# Patient Record
Sex: Female | Born: 1975 | Race: White | Hispanic: No | Marital: Married | Smoking: Never smoker
Health system: Southern US, Community
[De-identification: ages and names within clinical notes are randomized; demographics above are authoritative.]

## PROBLEM LIST (undated history)

## (undated) ENCOUNTER — Inpatient Hospital Stay (HOSPITAL_COMMUNITY): Payer: Managed Care, Other (non HMO)

## (undated) DIAGNOSIS — F32A Depression, unspecified: Secondary | ICD-10-CM

## (undated) DIAGNOSIS — O139 Gestational [pregnancy-induced] hypertension without significant proteinuria, unspecified trimester: Secondary | ICD-10-CM

## (undated) DIAGNOSIS — G4730 Sleep apnea, unspecified: Secondary | ICD-10-CM

## (undated) DIAGNOSIS — E282 Polycystic ovarian syndrome: Secondary | ICD-10-CM

## (undated) DIAGNOSIS — K219 Gastro-esophageal reflux disease without esophagitis: Secondary | ICD-10-CM

## (undated) DIAGNOSIS — F329 Major depressive disorder, single episode, unspecified: Secondary | ICD-10-CM

## (undated) DIAGNOSIS — B999 Unspecified infectious disease: Secondary | ICD-10-CM

## (undated) DIAGNOSIS — R51 Headache: Secondary | ICD-10-CM

## (undated) HISTORY — DX: Polycystic ovarian syndrome: E28.2

## (undated) HISTORY — DX: Gestational (pregnancy-induced) hypertension without significant proteinuria, unspecified trimester: O13.9

## (undated) HISTORY — DX: Headache: R51

## (undated) HISTORY — DX: Depression, unspecified: F32.A

## (undated) HISTORY — DX: Sleep apnea, unspecified: G47.30

## (undated) HISTORY — DX: Unspecified infectious disease: B99.9

## (undated) HISTORY — PX: OTHER SURGICAL HISTORY: SHX169

## (undated) HISTORY — DX: Major depressive disorder, single episode, unspecified: F32.9

---
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
## 2001-11-04 ENCOUNTER — Other Ambulatory Visit: Admission: RE | Admit: 2001-11-04 | Discharge: 2001-11-04 | Payer: Self-pay | Admitting: Obstetrics and Gynecology

## 2002-07-15 ENCOUNTER — Emergency Department (HOSPITAL_COMMUNITY): Admission: EM | Admit: 2002-07-15 | Discharge: 2002-07-15 | Payer: Self-pay | Admitting: Emergency Medicine

## 2002-07-15 ENCOUNTER — Encounter: Payer: Self-pay | Admitting: Emergency Medicine

## 2003-01-23 ENCOUNTER — Other Ambulatory Visit: Admission: RE | Admit: 2003-01-23 | Discharge: 2003-01-23 | Payer: Self-pay | Admitting: Obstetrics and Gynecology

## 2004-02-13 ENCOUNTER — Ambulatory Visit (HOSPITAL_COMMUNITY): Admission: RE | Admit: 2004-02-13 | Discharge: 2004-02-13 | Payer: Self-pay | Admitting: Obstetrics and Gynecology

## 2004-03-19 ENCOUNTER — Other Ambulatory Visit: Admission: RE | Admit: 2004-03-19 | Discharge: 2004-03-19 | Payer: Self-pay | Admitting: Obstetrics and Gynecology

## 2004-05-28 ENCOUNTER — Ambulatory Visit (HOSPITAL_COMMUNITY): Admission: RE | Admit: 2004-05-28 | Discharge: 2004-05-28 | Payer: Self-pay | Admitting: Obstetrics and Gynecology

## 2004-07-17 ENCOUNTER — Inpatient Hospital Stay (HOSPITAL_COMMUNITY): Admission: AD | Admit: 2004-07-17 | Discharge: 2004-07-17 | Payer: Self-pay | Admitting: Obstetrics and Gynecology

## 2004-10-20 ENCOUNTER — Inpatient Hospital Stay (HOSPITAL_COMMUNITY): Admission: AD | Admit: 2004-10-20 | Discharge: 2004-10-22 | Payer: Self-pay | Admitting: Obstetrics and Gynecology

## 2005-09-10 ENCOUNTER — Ambulatory Visit: Payer: Self-pay | Admitting: Internal Medicine

## 2005-10-26 ENCOUNTER — Ambulatory Visit: Payer: Self-pay | Admitting: Internal Medicine

## 2005-11-11 ENCOUNTER — Other Ambulatory Visit: Admission: RE | Admit: 2005-11-11 | Discharge: 2005-11-11 | Payer: Self-pay | Admitting: Obstetrics and Gynecology

## 2007-03-10 DIAGNOSIS — O139 Gestational [pregnancy-induced] hypertension without significant proteinuria, unspecified trimester: Secondary | ICD-10-CM

## 2007-03-10 HISTORY — DX: Gestational (pregnancy-induced) hypertension without significant proteinuria, unspecified trimester: O13.9

## 2007-09-08 ENCOUNTER — Emergency Department (HOSPITAL_COMMUNITY): Admission: EM | Admit: 2007-09-08 | Discharge: 2007-09-08 | Payer: Self-pay | Admitting: Emergency Medicine

## 2008-06-19 ENCOUNTER — Inpatient Hospital Stay (HOSPITAL_COMMUNITY): Admission: AD | Admit: 2008-06-19 | Discharge: 2008-06-19 | Payer: Self-pay | Admitting: Obstetrics and Gynecology

## 2008-06-21 ENCOUNTER — Inpatient Hospital Stay (HOSPITAL_COMMUNITY): Admission: AD | Admit: 2008-06-21 | Discharge: 2008-06-21 | Payer: Self-pay | Admitting: Obstetrics and Gynecology

## 2008-07-13 ENCOUNTER — Inpatient Hospital Stay (HOSPITAL_COMMUNITY): Admission: AD | Admit: 2008-07-13 | Discharge: 2008-07-13 | Payer: Self-pay | Admitting: Obstetrics and Gynecology

## 2008-07-15 ENCOUNTER — Inpatient Hospital Stay (HOSPITAL_COMMUNITY): Admission: AD | Admit: 2008-07-15 | Discharge: 2008-07-15 | Payer: Self-pay | Admitting: Obstetrics and Gynecology

## 2008-07-20 ENCOUNTER — Inpatient Hospital Stay (HOSPITAL_COMMUNITY): Admission: AD | Admit: 2008-07-20 | Discharge: 2008-07-20 | Payer: Self-pay | Admitting: Obstetrics and Gynecology

## 2008-07-25 ENCOUNTER — Encounter (INDEPENDENT_AMBULATORY_CARE_PROVIDER_SITE_OTHER): Payer: Self-pay | Admitting: Obstetrics and Gynecology

## 2008-07-25 ENCOUNTER — Inpatient Hospital Stay (HOSPITAL_COMMUNITY): Admission: AD | Admit: 2008-07-25 | Discharge: 2008-07-27 | Payer: Self-pay | Admitting: Obstetrics and Gynecology

## 2010-06-17 LAB — URINALYSIS, ROUTINE W REFLEX MICROSCOPIC
Bilirubin Urine: NEGATIVE
Bilirubin Urine: NEGATIVE
Bilirubin Urine: NEGATIVE
Glucose, UA: NEGATIVE mg/dL
Glucose, UA: NEGATIVE mg/dL
Glucose, UA: NEGATIVE mg/dL
Hgb urine dipstick: NEGATIVE
Hgb urine dipstick: NEGATIVE
Hgb urine dipstick: NEGATIVE
Ketones, ur: NEGATIVE mg/dL
Ketones, ur: NEGATIVE mg/dL
Ketones, ur: NEGATIVE mg/dL
Nitrite: NEGATIVE
Nitrite: NEGATIVE
Nitrite: NEGATIVE
Protein, ur: NEGATIVE mg/dL
Protein, ur: NEGATIVE mg/dL
Protein, ur: NEGATIVE mg/dL
Specific Gravity, Urine: <1.005 — ABNORMAL LOW (ref 1.005–1.030)
Specific Gravity, Urine: 1.01 (ref 1.005–1.030)
Specific Gravity, Urine: <1.005 — ABNORMAL LOW (ref 1.005–1.030)
Urobilinogen, UA: 0.2 mg/dL (ref 0.0–1.0)
Urobilinogen, UA: 0.2 mg/dL (ref 0.0–1.0)
Urobilinogen, UA: 0.2 mg/dL (ref 0.0–1.0)
pH: 6 (ref 5.0–8.0)
pH: 6 (ref 5.0–8.0)
pH: 6.5 (ref 5.0–8.0)

## 2010-06-17 LAB — COMPREHENSIVE METABOLIC PANEL
ALT: 14 U/L (ref 0–35)
ALT: 12 U/L (ref 0–35)
ALT: 13 U/L (ref 0–35)
AST: 26 U/L (ref 0–37)
AST: 21 U/L (ref 0–37)
AST: 21 U/L (ref 0–37)
Albumin: 2.7 g/dL — ABNORMAL LOW (ref 3.5–5.2)
Albumin: 2.7 g/dL — ABNORMAL LOW (ref 3.5–5.2)
Albumin: 2.8 g/dL — ABNORMAL LOW (ref 3.5–5.2)
Alkaline Phosphatase: 111 U/L (ref 39–117)
Alkaline Phosphatase: 102 U/L (ref 39–117)
Alkaline Phosphatase: 98 U/L (ref 39–117)
BUN: 8 mg/dL (ref 6–23)
BUN: 5 mg/dL — ABNORMAL LOW (ref 6–23)
BUN: 8 mg/dL (ref 6–23)
CO2: 22 mEq/L (ref 19–32)
CO2: 22 mEq/L (ref 19–32)
CO2: 24 mEq/L (ref 19–32)
Calcium: 9.3 mg/dL (ref 8.4–10.5)
Calcium: 9.2 mg/dL (ref 8.4–10.5)
Calcium: 9.4 mg/dL (ref 8.4–10.5)
Chloride: 104 mEq/L (ref 96–112)
Chloride: 102 mEq/L (ref 96–112)
Chloride: 104 mEq/L (ref 96–112)
Creatinine, Ser: 0.88 mg/dL (ref 0.4–1.2)
Creatinine, Ser: 0.72 mg/dL (ref 0.4–1.2)
Creatinine, Ser: 0.77 mg/dL (ref 0.4–1.2)
GFR calc Af Amer: >60 mL/min (ref >60)
GFR calc Af Amer: >60 mL/min (ref >60)
GFR calc Af Amer: >60 mL/min (ref >60)
GFR calc non Af Amer: >60 mL/min (ref >60)
GFR calc non Af Amer: >60 mL/min (ref >60)
GFR calc non Af Amer: >60 mL/min (ref >60)
Glucose, Bld: 106 mg/dL — ABNORMAL HIGH (ref 70–99)
Glucose, Bld: 71 mg/dL (ref 70–99)
Glucose, Bld: 84 mg/dL (ref 70–99)
Potassium: 3.7 mEq/L (ref 3.5–5.1)
Potassium: 4.1 mEq/L (ref 3.5–5.1)
Potassium: 4.2 mEq/L (ref 3.5–5.1)
Sodium: 135 mEq/L (ref 135–145)
Sodium: 133 mEq/L — ABNORMAL LOW (ref 135–145)
Sodium: 135 mEq/L (ref 135–145)
Total Bilirubin: 0.6 mg/dL (ref 0.3–1.2)
Total Bilirubin: 0.3 mg/dL (ref 0.3–1.2)
Total Bilirubin: 0.4 mg/dL (ref 0.3–1.2)
Total Protein: 6.3 g/dL (ref 6.0–8.3)
Total Protein: 5.6 g/dL — ABNORMAL LOW (ref 6.0–8.3)
Total Protein: 6.2 g/dL (ref 6.0–8.3)

## 2010-06-17 LAB — CBC
HCT: 30.5 % — ABNORMAL LOW (ref 36.0–46.0)
HCT: 37.4 % (ref 36.0–46.0)
HCT: 36.9 % (ref 36.0–46.0)
HCT: 36.9 % (ref 36.0–46.0)
Hemoglobin: 10.8 g/dL — ABNORMAL LOW (ref 12.0–15.0)
Hemoglobin: 12.9 g/dL (ref 12.0–15.0)
Hemoglobin: 12.6 g/dL (ref 12.0–15.0)
Hemoglobin: 12.9 g/dL (ref 12.0–15.0)
MCHC: 34.6 g/dL (ref 30.0–36.0)
MCHC: 35.4 g/dL (ref 30.0–36.0)
MCHC: 34.3 g/dL (ref 30.0–36.0)
MCHC: 34.9 g/dL (ref 30.0–36.0)
MCV: 96.1 fL (ref 78.0–100.0)
MCV: 96.6 fL (ref 78.0–100.0)
MCV: 95.4 fL (ref 78.0–100.0)
MCV: 95.7 fL (ref 78.0–100.0)
Platelets: 206 10*3/uL (ref 150–400)
Platelets: 232 10*3/uL (ref 150–400)
Platelets: 253 10*3/uL (ref 150–400)
Platelets: 266 10*3/uL (ref 150–400)
RBC: 3.15 MIL/uL — ABNORMAL LOW (ref 3.87–5.11)
RBC: 3.89 MIL/uL (ref 3.87–5.11)
RBC: 3.86 MIL/uL — ABNORMAL LOW (ref 3.87–5.11)
RBC: 3.87 MIL/uL (ref 3.87–5.11)
RDW: 14.5 % (ref 11.5–15.5)
RDW: 14.7 % (ref 11.5–15.5)
RDW: 14.2 % (ref 11.5–15.5)
RDW: 14.7 % (ref 11.5–15.5)
WBC: 10.5 10*3/uL (ref 4.0–10.5)
WBC: 13.1 10*3/uL — ABNORMAL HIGH (ref 4.0–10.5)
WBC: 11.2 10*3/uL — ABNORMAL HIGH (ref 4.0–10.5)
WBC: 13.6 10*3/uL — ABNORMAL HIGH (ref 4.0–10.5)

## 2010-06-17 LAB — LACTATE DEHYDROGENASE
LDH: 136 U/L (ref 94–250)
LDH: 136 U/L (ref 94–250)
LDH: 141 U/L (ref 94–250)

## 2010-06-17 LAB — URIC ACID
Uric Acid, Serum: 5.3 mg/dL (ref 2.4–7.0)
Uric Acid, Serum: 5 mg/dL (ref 2.4–7.0)
Uric Acid, Serum: 5.3 mg/dL (ref 2.4–7.0)

## 2010-06-17 LAB — RPR: RPR Ser Ql: NONREACTIVE

## 2010-06-18 LAB — COMPREHENSIVE METABOLIC PANEL
ALT: 12 U/L (ref 0–35)
AST: 21 U/L (ref 0–37)
Albumin: 2.9 g/dL — ABNORMAL LOW (ref 3.5–5.2)
Alkaline Phosphatase: 75 U/L (ref 39–117)
BUN: 7 mg/dL (ref 6–23)
CO2: 22 mEq/L (ref 19–32)
Calcium: 9.5 mg/dL (ref 8.4–10.5)
Chloride: 102 mEq/L (ref 96–112)
Creatinine, Ser: 0.69 mg/dL (ref 0.4–1.2)
GFR calc Af Amer: >60 mL/min (ref >60)
GFR calc non Af Amer: >60 mL/min (ref >60)
Glucose, Bld: 125 mg/dL — ABNORMAL HIGH (ref 70–99)
Potassium: 3.9 mEq/L (ref 3.5–5.1)
Sodium: 133 mEq/L — ABNORMAL LOW (ref 135–145)
Total Bilirubin: 0.3 mg/dL (ref 0.3–1.2)
Total Protein: 5.8 g/dL — ABNORMAL LOW (ref 6.0–8.3)

## 2010-06-18 LAB — CBC
HCT: 38.6 % (ref 36.0–46.0)
Hemoglobin: 13.4 g/dL (ref 12.0–15.0)
MCHC: 34.7 g/dL (ref 30.0–36.0)
MCV: 95.5 fL (ref 78.0–100.0)
Platelets: 277 10*3/uL (ref 150–400)
RBC: 4.04 MIL/uL (ref 3.87–5.11)
RDW: 14.4 % (ref 11.5–15.5)
WBC: 12.6 10*3/uL — ABNORMAL HIGH (ref 4.0–10.5)

## 2010-06-18 LAB — WET PREP, GENITAL

## 2010-06-18 LAB — URINALYSIS, ROUTINE W REFLEX MICROSCOPIC
Glucose, UA: NEGATIVE mg/dL
Nitrite: NEGATIVE
Specific Gravity, Urine: 1.015 (ref 1.005–1.030)
pH: 6 (ref 5.0–8.0)

## 2010-06-18 LAB — URIC ACID: Uric Acid, Serum: 4.9 mg/dL (ref 2.4–7.0)
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
## 2011-03-10 DIAGNOSIS — G4730 Sleep apnea, unspecified: Secondary | ICD-10-CM

## 2011-03-10 HISTORY — DX: Sleep apnea, unspecified: G47.30

## 2011-10-05 ENCOUNTER — Other Ambulatory Visit: Payer: Self-pay

## 2011-10-05 ENCOUNTER — Telehealth: Payer: Self-pay | Admitting: Obstetrics and Gynecology

## 2011-10-05 ENCOUNTER — Other Ambulatory Visit: Payer: Commercial Indemnity

## 2011-10-05 DIAGNOSIS — Z3490 Encounter for supervision of normal pregnancy, unspecified, unspecified trimester: Secondary | ICD-10-CM

## 2011-10-05 NOTE — Telephone Encounter (Signed)
Spoke with pt rgd concern pt early preg states need appt to discuss meds has history of sab wants appt this week with ND or any provider advised pt first available appt 10/12/11 pt has appt 10/12/11 8:15 with ND pt voice understanding

## 2011-10-05 NOTE — Telephone Encounter (Signed)
Niccole/calling back

## 2011-10-05 NOTE — Telephone Encounter (Signed)
Amber Carney/chart received

## 2011-10-06 LAB — HCG, QUANTITATIVE, PREGNANCY: hCG, Beta Chain, Quant, S: 37.5 m[IU]/mL

## 2011-10-06 LAB — PROGESTERONE: Progesterone: 9.5 ng/mL

## 2011-10-07 ENCOUNTER — Encounter: Payer: Self-pay | Admitting: Obstetrics and Gynecology

## 2011-10-07 ENCOUNTER — Ambulatory Visit (INDEPENDENT_AMBULATORY_CARE_PROVIDER_SITE_OTHER): Payer: Commercial Indemnity | Admitting: Obstetrics and Gynecology

## 2011-10-07 ENCOUNTER — Telehealth: Payer: Self-pay | Admitting: Obstetrics and Gynecology

## 2011-10-07 VITALS — BP 122/82 | Wt 255.0 lb

## 2011-10-07 DIAGNOSIS — O26849 Uterine size-date discrepancy, unspecified trimester: Secondary | ICD-10-CM

## 2011-10-08 NOTE — Telephone Encounter (Signed)
Lm on vm tcb with pharm to call in rx

## 2011-10-08 NOTE — Progress Notes (Signed)
Progesterone 9.5  BHCG 37.5   Repeat BHCG until > 1500  Then u/s at that time for viability. Progesterone supp 25 mg pv bid until after 1st trimester completed BP stable pt stopped Diovan. Will give aldomet if bp increases.

## 2011-10-12 ENCOUNTER — Encounter: Payer: Self-pay | Admitting: Obstetrics and Gynecology

## 2011-10-12 ENCOUNTER — Other Ambulatory Visit: Payer: Self-pay

## 2011-10-12 DIAGNOSIS — Z3490 Encounter for supervision of normal pregnancy, unspecified, unspecified trimester: Secondary | ICD-10-CM

## 2011-10-13 ENCOUNTER — Telehealth: Payer: Self-pay | Admitting: Obstetrics and Gynecology

## 2011-10-13 ENCOUNTER — Other Ambulatory Visit: Payer: Commercial Indemnity

## 2011-10-13 DIAGNOSIS — N912 Amenorrhea, unspecified: Secondary | ICD-10-CM

## 2011-10-14 LAB — HCG, QUANTITATIVE, PREGNANCY: hCG, Beta Chain, Quant, S: 1249.2 m[IU]/mL

## 2011-10-16 NOTE — Telephone Encounter (Signed)
Try calling pt rgd mag pt not available will call back later

## 2011-10-19 ENCOUNTER — Other Ambulatory Visit: Payer: Commercial Indemnity

## 2011-10-19 DIAGNOSIS — Z331 Pregnant state, incidental: Secondary | ICD-10-CM

## 2011-10-19 NOTE — Telephone Encounter (Signed)
Spoke with pt informed quant results informed need another quant levels are >1500 then can schd u/s pt has appt today at 2:15 lab visit quant pt voice understanding

## 2011-10-19 NOTE — Telephone Encounter (Signed)
NICCOLE/ADDRESSING ISSUE

## 2011-10-21 NOTE — Telephone Encounter (Signed)
TRIAGE/RES °

## 2011-10-21 NOTE — Telephone Encounter (Signed)
Spoke with pt rgd msg informed quant [id number] can have u/s per ND pt has appt 10/22/11 at 10:00 for u/s f/u with AR at 10:30 pt voice understading

## 2011-10-22 ENCOUNTER — Other Ambulatory Visit: Payer: Self-pay | Admitting: Obstetrics and Gynecology

## 2011-10-22 ENCOUNTER — Encounter: Payer: Self-pay | Admitting: Obstetrics and Gynecology

## 2011-10-22 ENCOUNTER — Ambulatory Visit (INDEPENDENT_AMBULATORY_CARE_PROVIDER_SITE_OTHER): Payer: Commercial Indemnity | Admitting: Obstetrics and Gynecology

## 2011-10-22 ENCOUNTER — Ambulatory Visit (INDEPENDENT_AMBULATORY_CARE_PROVIDER_SITE_OTHER): Payer: Commercial Indemnity

## 2011-10-22 VITALS — BP 114/64 | Ht 67.0 in | Wt 254.0 lb

## 2011-10-22 DIAGNOSIS — O09299 Supervision of pregnancy with other poor reproductive or obstetric history, unspecified trimester: Secondary | ICD-10-CM

## 2011-10-22 DIAGNOSIS — O26849 Uterine size-date discrepancy, unspecified trimester: Secondary | ICD-10-CM

## 2011-10-22 DIAGNOSIS — Z3490 Encounter for supervision of normal pregnancy, unspecified, unspecified trimester: Secondary | ICD-10-CM

## 2011-10-22 NOTE — Progress Notes (Signed)
Ultrasound today with EDD of June 17, 2012 which is consistent with the date of conception per patient which was July 17 they gave her a due date of April 19 although her.  Gave her a due date of able forth with the last menstrual period of June 28 E. Ultrasound today showed normal bilateral ovaries and a 5 week 6 day IUP with the fetal heart rate of 105 and 145 The patient is coming taking 25 mg of progesterone per vagina q.d. Secondary to a low progesterone level  Filed Vitals:   10/22/11 1040  BP: 114/64   A/P U/S reviewed Cont prog for first tri Sched new ob int and w/u  Pt wants to see CNMS

## 2011-10-29 LAB — US OB TRANSVAGINAL

## 2011-11-10 ENCOUNTER — Ambulatory Visit (INDEPENDENT_AMBULATORY_CARE_PROVIDER_SITE_OTHER): Payer: Commercial Indemnity | Admitting: Obstetrics and Gynecology

## 2011-11-10 DIAGNOSIS — Z331 Pregnant state, incidental: Secondary | ICD-10-CM

## 2011-11-10 LAB — POCT URINALYSIS DIPSTICK
Bilirubin, UA: NEGATIVE
Ketones, UA: NEGATIVE
Leukocytes, UA: NEGATIVE
pH, UA: 5

## 2011-11-10 NOTE — Progress Notes (Signed)
NOB interview completed.  Pt states will be moving to [place] in December.

## 2011-11-11 ENCOUNTER — Emergency Department (HOSPITAL_COMMUNITY)
Admission: EM | Admit: 2011-11-11 | Discharge: 2011-11-12 | Disposition: A | Discharge status: Home / Self Care | Payer: Commercial Indemnity | Attending: Emergency Medicine | Admitting: Emergency Medicine

## 2011-11-11 ENCOUNTER — Encounter (HOSPITAL_COMMUNITY): Payer: Self-pay | Admitting: Emergency Medicine

## 2011-11-11 DIAGNOSIS — Z79899 Other long term (current) drug therapy: Secondary | ICD-10-CM | POA: Insufficient documentation

## 2011-11-11 DIAGNOSIS — R0789 Other chest pain: Secondary | ICD-10-CM

## 2011-11-11 DIAGNOSIS — R0602 Shortness of breath: Secondary | ICD-10-CM | POA: Insufficient documentation

## 2011-11-11 DIAGNOSIS — R071 Chest pain on breathing: Secondary | ICD-10-CM | POA: Insufficient documentation

## 2011-11-11 DIAGNOSIS — O99891 Other specified diseases and conditions complicating pregnancy: Secondary | ICD-10-CM | POA: Insufficient documentation

## 2011-11-11 DIAGNOSIS — M549 Dorsalgia, unspecified: Secondary | ICD-10-CM | POA: Insufficient documentation

## 2011-11-11 DIAGNOSIS — R079 Chest pain, unspecified: Secondary | ICD-10-CM | POA: Insufficient documentation

## 2011-11-11 DIAGNOSIS — Z3490 Encounter for supervision of normal pregnancy, unspecified, unspecified trimester: Secondary | ICD-10-CM

## 2011-11-11 LAB — PRENATAL PANEL VII
Antibody Screen: NEGATIVE
Basophils Absolute: 0.1 10*3/uL (ref 0.0–0.1)
Basophils Relative: 1 % (ref 0–1)
Eosinophils Absolute: 0.1 10*3/uL (ref 0.0–0.7)
Eosinophils Relative: 1 % (ref 0–5)
HCT: 39.9 % (ref 36.0–46.0)
HIV: NONREACTIVE
Hemoglobin: 13.6 g/dL (ref 12.0–15.0)
Hepatitis B Surface Ag: NEGATIVE
Lymphocytes Relative: 25 % (ref 12–46)
Lymphs Abs: 2.1 10*3/uL (ref 0.7–4.0)
MCH: 31.7 pg (ref 26.0–34.0)
MCHC: 34.1 g/dL (ref 30.0–36.0)
MCV: 93 fL (ref 78.0–100.0)
Monocytes Absolute: 0.6 10*3/uL (ref 0.1–1.0)
Monocytes Relative: 7 % (ref 3–12)
Neutro Abs: 5.5 10*3/uL (ref 1.7–7.7)
Neutrophils Relative %: 66 % (ref 43–77)
Platelets: 282 10*3/uL (ref 150–400)
RBC: 4.29 MIL/uL (ref 3.87–5.11)
RDW: 14.3 % (ref 11.5–15.5)
Rh Type: POSITIVE
Rubella: 121.4 IU/mL — ABNORMAL HIGH
WBC: 8.3 10*3/uL (ref 4.0–10.5)

## 2011-11-11 NOTE — ED Notes (Signed)
Patient states she is [redacted] weeks pregnant.  Patient states she has been having shortness of breath all day today.

## 2011-11-12 ENCOUNTER — Emergency Department (HOSPITAL_COMMUNITY): Payer: Commercial Indemnity

## 2011-11-12 ENCOUNTER — Telehealth: Payer: Self-pay | Admitting: Obstetrics and Gynecology

## 2011-11-12 LAB — URINALYSIS, ROUTINE W REFLEX MICROSCOPIC
Bilirubin Urine: NEGATIVE
Glucose, UA: NEGATIVE mg/dL
Hgb urine dipstick: NEGATIVE
Protein, ur: NEGATIVE mg/dL
Urobilinogen, UA: 0.2 mg/dL (ref 0.0–1.0)

## 2011-11-12 LAB — COMPREHENSIVE METABOLIC PANEL
ALT: 15 U/L (ref 0–35)
AST: 18 U/L (ref 0–37)
Albumin: 3.8 g/dL (ref 3.5–5.2)
Alkaline Phosphatase: 61 U/L (ref 39–117)
BUN: 9 mg/dL (ref 6–23)
Chloride: 99 mEq/L (ref 96–112)
Potassium: 3.8 mEq/L (ref 3.5–5.1)
Total Bilirubin: 0.4 mg/dL (ref 0.3–1.2)

## 2011-11-12 LAB — CBC WITH DIFFERENTIAL/PLATELET
Basophils Absolute: 0 10*3/uL (ref 0.0–0.1)
Lymphs Abs: 2.2 10*3/uL (ref 0.7–4.0)
MCH: 32.2 pg (ref 26.0–34.0)
MCHC: 34.9 g/dL (ref 30.0–36.0)
MCV: 92.2 fL (ref 78.0–100.0)
Monocytes Absolute: 0.8 10*3/uL (ref 0.1–1.0)
Platelets: 246 10*3/uL (ref 150–400)
RDW: 13.7 % (ref 11.5–15.5)

## 2011-11-12 LAB — LIPASE, BLOOD: Lipase: 25 U/L (ref 11–59)

## 2011-11-12 MED ORDER — HYDROCODONE-ACETAMINOPHEN 5-325 MG PO TABS: 1.0000 | ORAL_TABLET | Freq: Four times a day (QID) | ORAL | Status: AC | PRN

## 2011-11-12 MED ORDER — SODIUM CHLORIDE 0.9 % IV SOLN: INTRAVENOUS | Status: DC

## 2011-11-12 MED ORDER — SODIUM CHLORIDE 0.9 % IV BOLUS (SEPSIS)
250.0000 mL | Freq: Once | INTRAVENOUS | Status: AC
  Administered 2011-11-12: 250 mL via INTRAVENOUS

## 2011-11-12 MED ORDER — IOHEXOL 350 MG/ML SOLN
100.0000 mL | Freq: Once | INTRAVENOUS | Status: AC | PRN
  Administered 2011-11-12: 100 mL via INTRAVENOUS

## 2011-11-12 NOTE — ED Notes (Signed)
Chest xray completed.

## 2011-11-12 NOTE — ED Notes (Signed)
Dr. Zackowski at bedside  

## 2011-11-12 NOTE — Telephone Encounter (Signed)
TC to SL who will inform DR ND to call regarding this pt or speak with KP if I am not available.

## 2011-11-12 NOTE — Progress Notes (Signed)
9:59 AM Venous dopplers showed no evidence of DVT.  Pt given discharge instructions for followup with her obstetrician.

## 2011-11-12 NOTE — ED Notes (Signed)
Patient sitting up in bed with eyes closed.  Offered to dim the lights, patient declined.  Patient states she breathes better sitting straight up.  No complaints at this time.
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
## 2011-11-12 NOTE — ED Notes (Signed)
Introduced self to patient. Patient denies needs at this time.

## 2011-11-12 NOTE — Telephone Encounter (Signed)
Tc to pt regarding msg, consulted w/ ND, informed pt was seen @ [facility] yesterday d/t SOB, everything was negative, no clots seen during evaluation, however pt still c/o SOB but it is getting better. Pt was given Hydrocodon by [facility].  Pt advised per ND to continue monitoring for now since it is getting slightly better, if sxs worsen to call for earlier appt than already scheduled 11/20/11 appt, pt voices understanding.

## 2011-11-12 NOTE — ED Notes (Signed)
Patient to have ultrasound this morning.

## 2011-11-12 NOTE — Telephone Encounter (Signed)
TRIAGE/APPT FOLLOW UP

## 2011-11-12 NOTE — ED Notes (Signed)
Patient with no complaints at this time. Respirations even and unlabored. Skin warm/dry. Discharge instructions reviewed with patient at this time. Patient given opportunity to voice concerns/ask questions. IV removed per policy and band-aid applied to site. Patient discharged at this time and left Emergency Department with steady gait.  

## 2011-11-12 NOTE — ED Notes (Signed)
Patient transported to CT scan via stretcher.

## 2011-11-20 ENCOUNTER — Encounter: Payer: Commercial Indemnity | Admitting: Obstetrics and Gynecology

## 2011-11-24 ENCOUNTER — Encounter: Payer: Self-pay | Admitting: Obstetrics and Gynecology

## 2011-11-24 ENCOUNTER — Ambulatory Visit (INDEPENDENT_AMBULATORY_CARE_PROVIDER_SITE_OTHER): Payer: Commercial Indemnity | Admitting: Obstetrics and Gynecology

## 2011-11-24 ENCOUNTER — Ambulatory Visit (INDEPENDENT_AMBULATORY_CARE_PROVIDER_SITE_OTHER): Payer: Commercial Indemnity

## 2011-11-24 VITALS — BP 104/84 | Wt 246.0 lb

## 2011-11-24 DIAGNOSIS — O09529 Supervision of elderly multigravida, unspecified trimester: Secondary | ICD-10-CM

## 2011-11-24 DIAGNOSIS — Z363 Encounter for antenatal screening for malformations: Secondary | ICD-10-CM

## 2011-11-24 DIAGNOSIS — O99891 Other specified diseases and conditions complicating pregnancy: Secondary | ICD-10-CM

## 2011-11-24 DIAGNOSIS — O09299 Supervision of pregnancy with other poor reproductive or obstetric history, unspecified trimester: Secondary | ICD-10-CM

## 2011-11-24 DIAGNOSIS — I10 Essential (primary) hypertension: Secondary | ICD-10-CM

## 2011-11-24 DIAGNOSIS — O2620 Pregnancy care for patient with recurrent pregnancy loss, unspecified trimester: Secondary | ICD-10-CM

## 2011-11-24 DIAGNOSIS — Z1389 Encounter for screening for other disorder: Secondary | ICD-10-CM

## 2011-11-24 DIAGNOSIS — Z8659 Personal history of other mental and behavioral disorders: Secondary | ICD-10-CM

## 2011-11-24 DIAGNOSIS — Z331 Pregnant state, incidental: Secondary | ICD-10-CM

## 2011-11-24 LAB — POCT URINALYSIS DIPSTICK
Bilirubin, UA: NEGATIVE
Blood, UA: NEGATIVE
Glucose, UA: NEGATIVE
Leukocytes, UA: NEGATIVE
Nitrite, UA: NEGATIVE
Urobilinogen, UA: NEGATIVE

## 2011-11-24 LAB — POCT WET PREP (WET MOUNT): Whiff Test: NEGATIVE

## 2011-11-24 LAB — US OB LIMITED

## 2011-11-24 MED ORDER — PROGESTERONE 25 MG VA SUPP: 25.0000 mg | Freq: Every day | VAGINAL | Status: DC

## 2011-11-24 NOTE — Patient Instructions (Addendum)
ABCs of Pregnancy A Antepartum care is very important. Be sure you see your doctor and get prenatal care as soon as you think you are pregnant. At this time, you will be tested for infection, genetic abnormalities and potential problems with you and the pregnancy. This is the time to discuss diet, exercise, work, medications, labor, pain medication during labor and the possibility of a cesarean delivery. Ask any questions that may concern you. It is important to see your doctor regularly throughout your pregnancy. Avoid exposure to toxic substances and chemicals - such as cleaning solvents, lead and mercury, some insecticides, and paint. Pregnant women should avoid exposure to paint fumes, and fumes that cause you to feel ill, dizzy or faint. When possible, it is a good idea to have a pre-pregnancy consultation with your caregiver to begin some important recommendations your caregiver suggests such as, taking folic acid, exercising, quitting smoking, avoiding alcoholic beverages, etc. B Breastfeeding is the healthiest choice for both you and your baby. It has many nutritional benefits for the baby and health benefits for the mother. It also creates a very tight and loving bond between the baby and mother. Talk to your doctor, your family and friends, and your employer about how you choose to feed your baby and how they can support you in your decision. Not all birth defects can be prevented, but a woman can take actions that may increase her chance of having a healthy baby. Many birth defects happen very early in pregnancy, sometimes before a woman even knows she is pregnant. Birth defects or abnormalities of any child in your or the father's family should be discussed with your caregiver. Get a good support bra as your breast size changes. Wear it especially when you exercise and when nursing.  C Celebrate the news of your pregnancy with the your spouse/father and family. Childbirth classes are helpful to  take for you and the spouse/father because it helps to understand what happens during the pregnancy, labor and delivery. Cesarean delivery should be discussed with your doctor so you are prepared for that possibility. The pros and cons of circumcision if it is a boy, should be discussed with your pediatrician. Cigarette smoking during pregnancy can result in low birth weight babies. It has been associated with infertility, miscarriages, tubal pregnancies, infant death (mortality) and poor health (morbidity) in childhood. Additionally, cigarette smoking may cause long-term learning disabilities. If you smoke, you should try to quit before getting pregnant and not smoke during the pregnancy. Secondary smoke may also harm a mother and her developing baby. It is a good idea to ask people to stop smoking around you during your pregnancy and after the baby is born. Extra calcium is necessary when you are pregnant and is found in your prenatal vitamin, in dairy products, green leafy vegetables and in calcium supplements. D A healthy diet according to your current weight and height, along with vitamins and mineral supplements should be discussed with your caregiver. Domestic abuse or violence should be made known to your doctor right away to get the situation corrected. Drink more water when you exercise to keep hydrated. Discomfort of your back and legs usually develops and progresses from the middle of the second trimester through to delivery of the baby. This is because of the enlarging baby and uterus, which may also affect your balance. Do not take illegal drugs. Illegal drugs can seriously harm the baby and you. Drink extra fluids (water is best) throughout pregnancy to help   your body keep up with the increases in your blood volume. Drink at least 6 to 8 glasses of water, fruit juice, or milk each day. A good way to know you are drinking enough fluid is when your urine looks almost like clear water or is very light  yellow.  E Eat healthy to get the nutrients you and your unborn baby need. Your meals should include the five basic food groups. Exercise (30 minutes of light to moderate exercise a day) is important and encouraged during pregnancy, if there are no medical problems or problems with the pregnancy. Exercise that causes discomfort or dizziness should be stopped and reported to your caregiver. Emotions during pregnancy can change from being ecstatic to depression and should be understood by you, your partner and your family. F Fetal screening with ultrasound, amniocentesis and monitoring during pregnancy and labor is common and sometimes necessary. Take 400 micrograms of folic acid daily both before, when possible, and during the first few months of pregnancy to reduce the risk of birth defects of the brain and spine. All women who could possibly become pregnant should take a vitamin with folic acid, every day. It is also important to eat a healthy diet with fortified foods (enriched grain products, including cereals, rice, breads, and pastas) and foods with natural sources of folate (orange juice, green leafy vegetables, beans, peanuts, broccoli, asparagus, peas, and lentils). The father should be involved with all aspects of the pregnancy including, the prenatal care, childbirth classes, labor, delivery, and postpartum time. Fathers may also have emotional concerns about being a father, financial needs, and raising a family. G Genetic testing should be done appropriately. It is important to know your family and the father's history. If there have been problems with pregnancies or birth defects in your family, report these to your doctor. Also, genetic counselors can talk with you about the information you might need in making decisions about having a family. You can call a major medical center in your area for help in finding a board-certified genetic counselor. Genetic testing and counseling should be done  before pregnancy when possible, especially if there is a history of problems in the mother's or father's family. Certain ethnic backgrounds are more at risk for genetic defects. H Get familiar with the hospital where you will be having your baby. Get to know how long it takes to get there, the labor and delivery area, and the hospital procedures. Be sure your medical insurance is accepted there. Get your home ready for the baby including, clothes, the baby's room (when possible), furniture and car seat. Hand washing is important throughout the day, especially after handling raw meat and poultry, changing the baby's diaper or using the bathroom. This can help prevent the spread of many bacteria and viruses that cause infection. Your hair may become dry and thinner, but will return to normal a few weeks after the baby is born. Heartburn is a common problem that can be treated by taking antacids recommended by your caregiver, eating smaller meals 5 or 6 times a day, not drinking liquids when eating, drinking between meals and raising the head of your bed 2 to 3 inches. I Insurance to cover you, the baby, doctor and hospital should be reviewed so that you will be prepared to pay any costs not covered by your insurance plan. If you do not have medical insurance, there are usually clinics and services available for you in your community. Take 30 milligrams of iron during   your pregnancy as prescribed by your doctor to reduce the risk of low red blood cells (anemia) later in pregnancy. All women of childbearing age should eat a diet rich in iron. J There should be a joint effort for the mother, father and any other children to adapt to the pregnancy financially, emotionally, and psychologically during the pregnancy. Join a support group for moms-to-be. Or, join a class on parenting or childbirth. Have the family participate when possible. K Know your limits. Let your caregiver know if you experience any of the  following:   Pain of any kind.   Strong cramps.   You develop a lot of weight in a short period of time (5 pounds in 3 to 5 days).   Vaginal bleeding, leaking of amniotic fluid.   Headache, vision problems.   Dizziness, fainting, shortness of breath.   Chest pain.   Fever of 102 F (38.9 C) or higher.   Gush of clear fluid from your vagina.   Painful urination.   Domestic violence.   Irregular heartbeat (palpitations).   Rapid beating of the heart (tachycardia).   Constant feeling sick to your stomach (nauseous) and vomiting.   Trouble walking, fluid retention (edema).   Muscle weakness.   If your baby has decreased activity.   Persistent diarrhea.   Abnormal vaginal discharge.   Uterine contractions at 20-minute intervals.   Back pain that travels down your leg.  L Learn and practice that what you eat and drink should be in moderation and healthy for you and your baby. Legal drugs such as alcohol and caffeine are important issues for pregnant women. There is no safe amount of alcohol a woman can drink while pregnant. Fetal alcohol syndrome, a disorder characterized by growth retardation, facial abnormalities, and central nervous system dysfunction, is caused by a woman's use of alcohol during pregnancy. Caffeine, found in tea, coffee, soft drinks and chocolate, should also be limited. Be sure to read labels when trying to cut down on caffeine during pregnancy. More than 200 foods, beverages, and over-the-counter medications contain caffeine and have a high salt content! There are coffees and teas that do not contain caffeine. M Medical conditions such as diabetes, epilepsy, and high blood pressure should be treated and kept under control before pregnancy when possible, but especially during pregnancy. Ask your caregiver about any medications that may need to be changed or adjusted during pregnancy. If you are currently taking any medications, ask your caregiver if it  is safe to take them while you are pregnant or before getting pregnant when possible. Also, be sure to discuss any herbs or vitamins you are taking. They are medicines, too! Discuss with your doctor all medications, prescribed and over-the-counter, that you are taking. During your prenatal visit, discuss the medications your doctor may give you during labor and delivery. N Never be afraid to ask your doctor or caregiver questions about your health, the progress of the pregnancy, family problems, stressful situations, and recommendation for a pediatrician, if you do not have one. It is better to take all precautions and discuss any questions or concerns you may have during your office visits. It is a good idea to write down your questions before you visit the doctor. O Over-the-counter cough and cold remedies may contain alcohol or other ingredients that should be avoided during pregnancy. Ask your caregiver about prescription, herbs or over-the-counter medications that you are taking or may consider taking while pregnant.  P Physical activity during pregnancy can   benefit both you and your baby by lessening discomfort and fatigue, providing a sense of well-being, and increasing the likelihood of early recovery after delivery. Light to moderate exercise during pregnancy strengthens the belly (abdominal) and back muscles. This helps improve posture. Practicing yoga, walking, swimming, and cycling on a stationary bicycle are usually safe exercises for pregnant women. Avoid scuba diving, exercise at high altitudes (over 3000 feet), skiing, horseback riding, contact sports, etc. Always check with your doctor before beginning any kind of exercise, especially during pregnancy and especially if you did not exercise before getting pregnant. Q Queasiness, stomach upset and morning sickness are common during pregnancy. Eating a couple of crackers or dry toast before getting out of bed. Foods that you normally love may  make you feel sick to your stomach. You may need to substitute other nutritious foods. Eating 5 or 6 small meals a day instead of 3 large ones may make you feel better. Do not drink with your meals, drink between meals. Questions that you have should be written down and asked during your prenatal visits. R Read about and make plans to baby-proof your home. There are important tips for making your home a safer environment for your baby. Review the tips and make your home safer for you and your baby. Read food labels regarding calories, salt and fat content in the food. S Saunas, hot tubs, and steam rooms should be avoided while you are pregnant. Excessive high heat may be harmful during your pregnancy. Your caregiver will screen and examine you for sexually transmitted diseases and genetic disorders during your prenatal visits. Learn the signs of labor. Sexual relations while pregnant is safe unless there is a medical or pregnancy problem and your caregiver advises against it. T Traveling long distances should be avoided especially in the third trimester of your pregnancy. If you do have to travel out of state, be sure to take a copy of your medical records and medical insurance plan with you. You should not travel long distances without seeing your doctor first. Most airlines will not allow you to travel after 36 weeks of pregnancy. Toxoplasmosis is an infection caused by a parasite that can seriously harm an unborn baby. Avoid eating undercooked meat and handling cat litter. Be sure to wear gloves when gardening. Tingling of the hands and fingers is not unusual and is due to fluid retention. This will go away after the baby is born. U Womb (uterus) size increases during the first trimester. Your kidneys will begin to function more efficiently. This may cause you to feel the need to urinate more often. You may also leak urine when sneezing, coughing or laughing. This is due to the growing uterus pressing  against your bladder, which lies directly in front of and slightly under the uterus during the first few months of pregnancy. If you experience burning along with frequency of urination or bloody urine, be sure to tell your doctor. The size of your uterus in the third trimester may cause a problem with your balance. It is advisable to maintain good posture and avoid wearing high heels during this time. An ultrasound of your baby may be necessary during your pregnancy and is safe for you and your baby. V Vaccinations are an important concern for pregnant women. Get needed vaccines before pregnancy. Center for Disease Control (www.cdc.gov) has clear guidelines for the use of vaccines during pregnancy. Review the list, be sure to discuss it with your doctor. Prenatal vitamins are helpful   and healthy for you and the baby. Do not take extra vitamins except what is recommended. Taking too much of certain vitamins can cause overdose problems. Continuous vomiting should be reported to your caregiver. Varicose veins may appear especially if there is a family history of varicose veins. They should subside after the delivery of the baby. Support hose helps if there is leg discomfort. W Being overweight or underweight during pregnancy may cause problems. Try to get within 15 pounds of your ideal weight before pregnancy. Remember, pregnancy is not a time to be dieting! Do not stop eating or start skipping meals as your weight increases. Both you and your baby need the calories and nutrition you receive from a healthy diet. Be sure to consult with your doctor about your diet. There is a formula and diet plan available depending on whether you are overweight or underweight. Your caregiver or nutritionist can help and advise you if necessary. X Avoid X-rays. If you must have dental work or diagnostic tests, tell your dentist or physician that you are pregnant so that extra care can be taken. X-rays should only be taken when  the risks of not taking them outweigh the risk of taking them. If needed, only the minimum amount of radiation should be used. When X-rays are necessary, protective lead shields should be used to cover areas of the body that are not being X-rayed. Y Your baby loves you. Breastfeeding your baby creates a loving and very close bond between the two of you. Give your baby a healthy environment to live in while you are pregnant. Infants and children require constant care and guidance. Their health and safety should be carefully watched at all times. After the baby is born, rest or take a nap when the baby is sleeping. Z Get your ZZZs. Be sure to get plenty of rest. Resting on your side as often as possible, especially on your left side is advised. It provides the best circulation to your baby and helps reduce swelling. Try taking a nap for 30 to 45 minutes in the afternoon when possible. After the baby is born rest or take a nap when the baby is sleeping. Try elevating your feet for that amount of time when possible. It helps the circulation in your legs and helps reduce swelling.  Most information courtesy of the CDC. Document Released: 02/23/2005 Document Revised: 02/12/2011 Document Reviewed: 11/07/2008 [employer] Patient Information 2012 [employer]. 

## 2011-11-24 NOTE — Progress Notes (Signed)
[redacted]w[redacted]d  Last Pap: 03/30/2011 Pt declined Genetic Testing. Pt states she recently was exposed to radiation.b/c she had imaging studies done.pt states everything was "OK"  And nothing was found on imaging studies that was a threat.

## 2011-11-25 LAB — GC/CHLAMYDIA PROBE AMP, GENITAL: GC Probe Amp, Genital: NEGATIVE

## 2011-11-26 ENCOUNTER — Encounter: Payer: Self-pay | Admitting: Obstetrics and Gynecology

## 2011-11-26 DIAGNOSIS — Z8659 Personal history of other mental and behavioral disorders: Secondary | ICD-10-CM | POA: Insufficient documentation

## 2011-11-26 DIAGNOSIS — I10 Essential (primary) hypertension: Secondary | ICD-10-CM | POA: Insufficient documentation

## 2011-11-26 NOTE — Progress Notes (Signed)
  Subjective:    Amber Carney is being seen today for her first obstetrical visit.  This is a planned pregnancy. She is at [redacted]w[redacted]d gestation. Her obstetrical history is significant for advanced maternal age, obesity and hypertension (gestational w last pg), hx PPD took zoloft for 4 mos, PCOS, hx 2 SAB, low progesterone, . Relationship with FOB: spouse, living together. "Chargles"  Patient does intend to breast feed. Pregnancy history fully reviewed. Pt was seen at [facility] ED on 9-4 for c/o SOB, chest xray, CT angio and venous dopplers were all normal.   Patient reports no complaints.  Review of Systems:   Review of Systems  All other systems reviewed and are negative.    Objective:     BP 104/84  Wt 246 lb (111.585 kg)  LMP 09/04/2011 Physical Exam  Nursing note and vitals reviewed. Constitutional: She is oriented to person, place, and time. She appears well-developed and well-nourished.  HENT:  Head: Normocephalic.  Eyes: Pupils are equal, round, and reactive to light.  Neck: Normal range of motion. No thyromegaly present.  Cardiovascular: Normal rate, regular rhythm and normal heart sounds.   Respiratory: Effort normal and breath sounds normal.  GI: Soft. Bowel sounds are normal.  Genitourinary: Vagina normal.  Musculoskeletal: Normal range of motion. She exhibits no edema.  Neurological: She is alert and oriented to person, place, and time. She has normal reflexes.  Skin: Skin is warm and dry.  Psychiatric: She has a normal mood and affect. Her behavior is normal.    Maternal Exam:  Abdomen: Patient reports no abdominal tenderness. Fundal height is 10.    Introitus: Normal vulva. Normal vagina.  Pelvis: adequate for delivery.   Proven 8#6oz  Cervix: Cervix evaluated by sterile speculum exam and digital exam.     Fetal Exam Fetal Monitor Review: Mode: ultrasound.   Baseline rate: 147.         Assessment:    Pregnancy: [id number] Patient Active Problem List    Diagnosis  . AMA (advanced maternal age) multigravida 35+  . Two previous spontaneous abortions (SAB) affecting care of mother, antepartum  . Hx of postpartum depression, currently pregnant  . Hypertension   Unable to hear FHT's, [place] confirmed =147     Plan:     Initial labs rv'd. Bl type O pos, urine cx neg, hbg=13. 6,  Prenatal vitamins. Progesterone 25mg  PV until 13wks, per Dr Normand Sloop Problem list reviewed and updated. Pap - normal in January Wet prep - mild yeast GC/CT - sent  Pt declined genetic testing Plan baseline PIH labs and 24 hour urine  Plan anatomy US at 20wks Early 1hr gtt at 18wks, secondary to elevated BMI  Follow up in 4 weeks.    Malissa Hippo, CNM  11/26/2011

## 2011-12-22 ENCOUNTER — Ambulatory Visit (INDEPENDENT_AMBULATORY_CARE_PROVIDER_SITE_OTHER): Payer: Commercial Indemnity | Admitting: Obstetrics and Gynecology

## 2011-12-22 ENCOUNTER — Encounter: Payer: Self-pay | Admitting: Obstetrics and Gynecology

## 2011-12-22 VITALS — BP 114/74 | Wt 242.0 lb

## 2011-12-22 DIAGNOSIS — Z3480 Encounter for supervision of other normal pregnancy, unspecified trimester: Secondary | ICD-10-CM

## 2011-12-22 DIAGNOSIS — Z23 Encounter for immunization: Secondary | ICD-10-CM

## 2011-12-22 DIAGNOSIS — Z3689 Encounter for other specified antenatal screening: Secondary | ICD-10-CM

## 2011-12-22 MED ORDER — CITRANATAL 90 DHA 90-1 & 300 MG PO MISC: 1.0000 | Freq: Every day | ORAL | Status: DC

## 2011-12-22 NOTE — Progress Notes (Signed)
Doing well--nausea improved.  Need Citranatal Assure PNV Will be here until after delivery, then may move to [place] late next year. Declines genetic testing. [place] for anatomy [facility], with glucola.

## 2011-12-22 NOTE — Progress Notes (Signed)
Flu shot today. Alvino Chapel

## 2012-01-27 ENCOUNTER — Other Ambulatory Visit: Payer: Self-pay | Admitting: Obstetrics and Gynecology

## 2012-01-27 ENCOUNTER — Other Ambulatory Visit: Payer: Commercial Indemnity

## 2012-01-27 ENCOUNTER — Encounter: Payer: Self-pay | Admitting: Obstetrics and Gynecology

## 2012-01-27 ENCOUNTER — Ambulatory Visit (INDEPENDENT_AMBULATORY_CARE_PROVIDER_SITE_OTHER): Payer: Commercial Indemnity | Admitting: Obstetrics and Gynecology

## 2012-01-27 ENCOUNTER — Ambulatory Visit (INDEPENDENT_AMBULATORY_CARE_PROVIDER_SITE_OTHER): Payer: Commercial Indemnity

## 2012-01-27 VITALS — BP 118/78 | Wt 242.0 lb

## 2012-01-27 DIAGNOSIS — Z331 Pregnant state, incidental: Secondary | ICD-10-CM

## 2012-01-27 DIAGNOSIS — O09529 Supervision of elderly multigravida, unspecified trimester: Secondary | ICD-10-CM

## 2012-01-27 DIAGNOSIS — Z3689 Encounter for other specified antenatal screening: Secondary | ICD-10-CM

## 2012-01-27 LAB — US OB DETAIL + 14 WK

## 2012-01-27 NOTE — Progress Notes (Addendum)
[redacted]w[redacted]d Ultrasound shows:  SIUP  S=D     [place] EDD: 06/10/12           AFI: N/A           Cervical length: 4.19 cm           Placenta localization: anterior           Fetal presentation: variable  Anatomy not seen RVOT, DA AA                   Anatomy survey is normal, but inadequate views of RVOT and heart anatomy.           Gender : female Doing well. Declined genetic testing. Confirmed dating, with EDC by this [place] congruent with LMP and conception date.

## 2012-01-28 NOTE — Progress Notes (Signed)
Doing well--US today, with normal anatomy, but heart views limited.  EDC c/w previous dating. Normal cervical length. Declined genetic testing.  No weight gain (6 lb loss) but baby is growing, with EFW at 54% ile Will repeat US at [place] for completion of anatomy.

## 2012-02-25 ENCOUNTER — Ambulatory Visit (INDEPENDENT_AMBULATORY_CARE_PROVIDER_SITE_OTHER): Payer: Commercial Indemnity | Admitting: Obstetrics and Gynecology

## 2012-02-25 VITALS — BP 120/80 | Wt 244.0 lb

## 2012-02-25 DIAGNOSIS — Z331 Pregnant state, incidental: Secondary | ICD-10-CM

## 2012-02-25 NOTE — Progress Notes (Signed)
[redacted]w[redacted]d No complaints today.

## 2012-02-25 NOTE — Progress Notes (Signed)
Patient ID: Amber Carney, female   DOB: 13-Jun-1975, 36 y.o.   MRN: [id number] [redacted]w[redacted]d Needs f/o limited cardiac Return for 1 gtt O positive Reviewed s/sto report preterm labor if 6 contractions in 1 hour after po fluids, rest and frequent voids, srom, vag bleeding, daily kick counts to report, encouraged 8 water daily and frequent voids. Lavera Guise, CNM

## 2012-03-09 NOTE — L&D Delivery Note (Signed)
Delivery Note Pt progressed quickly after AROM.  At 7:59 PM a viable female was delivered via Vaginal, Spontaneous Delivery (Presentation: ; Occiput Anterior).  APGAR: 8, 9; weight 8 lb 10 oz (3912 g).   Placenta status: Intact, Expressed.  Cord: 3 vessels with the following complications: None.  Cord pH: N/A  Anesthesia: None  Episiotomy: None Lacerations: 1st degree Suture Repair: No repair-superficial laceration hemostatic Est. Blood Loss (mL): 400  Mom to postpartum.  Baby to nursery-stable.  Shandell Jallow O. 06/13/2012, 3:52 PM

## 2012-03-14 ENCOUNTER — Other Ambulatory Visit: Payer: Self-pay

## 2012-03-14 DIAGNOSIS — Z3689 Encounter for other specified antenatal screening: Secondary | ICD-10-CM

## 2012-03-15 ENCOUNTER — Ambulatory Visit (INDEPENDENT_AMBULATORY_CARE_PROVIDER_SITE_OTHER): Payer: Commercial Indemnity

## 2012-03-15 ENCOUNTER — Encounter: Payer: Self-pay | Admitting: Obstetrics and Gynecology

## 2012-03-15 ENCOUNTER — Ambulatory Visit (INDEPENDENT_AMBULATORY_CARE_PROVIDER_SITE_OTHER): Payer: Commercial Indemnity | Admitting: Obstetrics and Gynecology

## 2012-03-15 ENCOUNTER — Other Ambulatory Visit: Payer: Commercial Indemnity

## 2012-03-15 VITALS — BP 124/72 | Wt 243.0 lb

## 2012-03-15 DIAGNOSIS — Z331 Pregnant state, incidental: Secondary | ICD-10-CM

## 2012-03-15 DIAGNOSIS — Z3689 Encounter for other specified antenatal screening: Secondary | ICD-10-CM

## 2012-03-15 LAB — US OB FOLLOW UP

## 2012-03-15 NOTE — Progress Notes (Signed)
[redacted]w[redacted]d EFW: 2-11 68% AFI: 17.6 Cx: 3.73 Frank Breech, ant placenta A/P Glucola, hemoglobin and RPR today Fetal kick counts reviewed All patients questions answered Return in two weeks Continue Prenatal vitamins Blood type O pos

## 2012-03-31 ENCOUNTER — Encounter: Payer: Self-pay | Admitting: Obstetrics and Gynecology

## 2012-03-31 ENCOUNTER — Ambulatory Visit: Payer: Commercial Indemnity | Admitting: Obstetrics and Gynecology

## 2012-03-31 VITALS — BP 120/70 | Wt 244.0 lb

## 2012-03-31 DIAGNOSIS — Z3490 Encounter for supervision of normal pregnancy, unspecified, unspecified trimester: Secondary | ICD-10-CM

## 2012-03-31 NOTE — Progress Notes (Signed)
[redacted]w[redacted]d Pt feeling well with no complaints Glucola reviewed today

## 2012-03-31 NOTE — Progress Notes (Signed)
[redacted]w[redacted]d No concerns per pt  1 gtt 108 Hemoglobin 12.4  RPR NR

## 2012-04-14 ENCOUNTER — Encounter: Payer: Self-pay | Admitting: Obstetrics and Gynecology

## 2012-04-14 ENCOUNTER — Ambulatory Visit: Payer: Commercial Indemnity | Admitting: Obstetrics and Gynecology

## 2012-04-14 VITALS — BP 120/70 | Wt 247.0 lb

## 2012-04-14 DIAGNOSIS — Z331 Pregnant state, incidental: Secondary | ICD-10-CM

## 2012-04-14 NOTE — Progress Notes (Signed)
Pt w/o complaint today.  

## 2012-04-14 NOTE — Progress Notes (Signed)
[redacted]w[redacted]d S>D [place] @[facility] 

## 2012-04-28 ENCOUNTER — Ambulatory Visit: Payer: Commercial Indemnity | Admitting: Obstetrics and Gynecology

## 2012-04-28 ENCOUNTER — Ambulatory Visit: Payer: Commercial Indemnity

## 2012-04-28 ENCOUNTER — Encounter: Payer: Self-pay | Admitting: Obstetrics and Gynecology

## 2012-04-28 VITALS — BP 110/78 | Wt 250.0 lb

## 2012-04-28 DIAGNOSIS — Z331 Pregnant state, incidental: Secondary | ICD-10-CM

## 2012-04-28 DIAGNOSIS — O09529 Supervision of elderly multigravida, unspecified trimester: Secondary | ICD-10-CM

## 2012-04-28 NOTE — Progress Notes (Signed)
[redacted]w[redacted]d U/S today EFW 5lbs 12oz 78.7%, cvx 3.98cm, AFI 18.28cm, vtx, ant placenta FKCs RTO 2wks

## 2012-04-29 LAB — US OB FOLLOW UP

## 2012-05-12 ENCOUNTER — Encounter: Payer: Self-pay | Admitting: Certified Nurse Midwife

## 2012-05-12 ENCOUNTER — Encounter: Payer: Commercial Indemnity | Admitting: Family Medicine

## 2012-05-12 ENCOUNTER — Ambulatory Visit: Payer: Commercial Indemnity | Admitting: Certified Nurse Midwife

## 2012-05-12 VITALS — BP 122/68 | Wt 250.0 lb

## 2012-05-12 DIAGNOSIS — Z331 Pregnant state, incidental: Secondary | ICD-10-CM

## 2012-05-12 NOTE — Progress Notes (Signed)
[redacted]w[redacted]d No COmplaints

## 2012-05-13 NOTE — Progress Notes (Signed)
Doing well.  Just some back aches at night when trying to sleep. No S & S of labor B/P stable at home Labs   GBS done VE  FT/soft/minus 2            Wet prep nl Plan:   Balanced diet with increase in H2O   Questions ans. Re: Labor and delivery  Continue with same plan of care (Has schedule)  Rest, heat to back, avoid prolonged standing  C. Armer, CNM

## 2012-05-20 ENCOUNTER — Institutional Professional Consult (permissible substitution): Payer: Commercial Indemnity | Admitting: Certified Nurse Midwife

## 2012-05-20 ENCOUNTER — Ambulatory Visit: Payer: Commercial Indemnity | Admitting: Obstetrics and Gynecology

## 2012-05-20 ENCOUNTER — Encounter: Payer: Self-pay | Admitting: Obstetrics and Gynecology

## 2012-05-20 VITALS — BP 122/76 | Wt 250.0 lb

## 2012-05-20 DIAGNOSIS — O0993 Supervision of high risk pregnancy, unspecified, third trimester: Secondary | ICD-10-CM

## 2012-05-20 MED ORDER — SERTRALINE HCL 50 MG PO TABS: 50.0000 mg | ORAL_TABLET | Freq: Every day | ORAL | Status: DC

## 2012-05-20 NOTE — Progress Notes (Signed)
[redacted]w[redacted]d. No problems today. GBS negative. Pt will decide if she wants cx check.

## 2012-05-20 NOTE — Progress Notes (Signed)
[redacted]w[redacted]d  The patient has a history of postpartum depression.  She was started on Zoloft just prior to her delivery last time.  She did very well.  She wants to start again.  The patient will begin Zoloft 50 mg daily. Beta strep negative. Return to office in 1 week. Dr. Stefano Gaul

## 2012-05-26 ENCOUNTER — Encounter: Payer: Self-pay | Admitting: Obstetrics and Gynecology

## 2012-05-26 ENCOUNTER — Ambulatory Visit: Payer: Commercial Indemnity | Admitting: Obstetrics and Gynecology

## 2012-05-26 VITALS — BP 132/82 | Wt 251.0 lb

## 2012-05-26 DIAGNOSIS — Z331 Pregnant state, incidental: Secondary | ICD-10-CM

## 2012-05-26 NOTE — Progress Notes (Signed)
[redacted]w[redacted]d A/P GBS negative Fetal kick counts reviewed Labor reviewed with pt All patients  questions answered

## 2012-05-26 NOTE — Patient Instructions (Signed)

## 2012-05-26 NOTE — Progress Notes (Signed)
Pt w/o complaint, requests cervix check.

## 2012-06-02 ENCOUNTER — Other Ambulatory Visit: Payer: Self-pay | Admitting: Obstetrics and Gynecology

## 2012-06-02 LAB — CBC
MCV: 88.7 fL (ref 78.0–100.0)
Platelets: 294 10*3/uL (ref 150–400)
RDW: 14.8 % (ref 11.5–15.5)
WBC: 10.6 10*3/uL — ABNORMAL HIGH (ref 4.0–10.5)

## 2012-06-03 ENCOUNTER — Telehealth (HOSPITAL_COMMUNITY): Payer: Self-pay | Admitting: *Deleted

## 2012-06-03 ENCOUNTER — Encounter (HOSPITAL_COMMUNITY): Payer: Self-pay | Admitting: *Deleted

## 2012-06-03 ENCOUNTER — Telehealth: Payer: Self-pay | Admitting: Obstetrics and Gynecology

## 2012-06-03 LAB — COMPREHENSIVE METABOLIC PANEL
ALT: 8 U/L (ref 0–35)
AST: 13 U/L (ref 0–37)
Calcium: 9.1 mg/dL (ref 8.4–10.5)
Chloride: 102 mEq/L (ref 96–112)
Creat: 0.73 mg/dL (ref 0.50–1.10)

## 2012-06-03 NOTE — Telephone Encounter (Signed)
Induction scheduled for 06/10/12 @ 7:30 pm with AR/SL. -ap  ° °

## 2012-06-03 NOTE — Telephone Encounter (Signed)
Preadmission screen  

## 2012-06-04 ENCOUNTER — Inpatient Hospital Stay (HOSPITAL_COMMUNITY)
Admission: AD | Admit: 2012-06-04 | Discharge: 2012-06-04 | Disposition: A | Discharge status: Home / Self Care | Payer: Managed Care, Other (non HMO) | Source: Ambulatory Visit | Attending: Obstetrics and Gynecology | Admitting: Obstetrics and Gynecology

## 2012-06-04 DIAGNOSIS — I10 Essential (primary) hypertension: Secondary | ICD-10-CM

## 2012-06-04 DIAGNOSIS — O2623 Pregnancy care for patient with recurrent pregnancy loss, third trimester: Secondary | ICD-10-CM

## 2012-06-04 DIAGNOSIS — O09523 Supervision of elderly multigravida, third trimester: Secondary | ICD-10-CM

## 2012-06-04 DIAGNOSIS — O479 False labor, unspecified: Secondary | ICD-10-CM | POA: Insufficient documentation

## 2012-06-04 DIAGNOSIS — O139 Gestational [pregnancy-induced] hypertension without significant proteinuria, unspecified trimester: Secondary | ICD-10-CM | POA: Insufficient documentation

## 2012-06-04 LAB — CREATININE CLEARANCE, URINE, 24 HOUR
Collection Interval-CRCL: 24 hours
Creatinine Clearance: 144 mL/min — ABNORMAL HIGH (ref 75–115)
Creatinine, 24H Ur: 1510 mg/d (ref 700–1800)
Creatinine, Urine: 65.66 mg/dL
Creatinine: 0.73 mg/dL (ref 0.50–1.10)

## 2012-06-04 NOTE — MAU Note (Signed)
Pt here for NST and B/P check. Reports mild headache.
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
## 2012-06-10 ENCOUNTER — Observation Stay (HOSPITAL_COMMUNITY)
Admission: AD | Admit: 2012-06-10 | Discharge: 2012-06-10 | DRG: 781 | Disposition: A | Discharge status: Home / Self Care | Payer: Managed Care, Other (non HMO) | Source: Ambulatory Visit | Attending: Obstetrics and Gynecology | Admitting: Obstetrics and Gynecology

## 2012-06-10 ENCOUNTER — Encounter (HOSPITAL_COMMUNITY): Payer: Self-pay | Admitting: *Deleted

## 2012-06-10 ENCOUNTER — Inpatient Hospital Stay (HOSPITAL_COMMUNITY): Admission: RE | Admit: 2012-06-10 | Payer: Commercial Indemnity | Source: Ambulatory Visit

## 2012-06-10 DIAGNOSIS — O09523 Supervision of elderly multigravida, third trimester: Secondary | ICD-10-CM

## 2012-06-10 DIAGNOSIS — F3289 Other specified depressive episodes: Secondary | ICD-10-CM | POA: Insufficient documentation

## 2012-06-10 DIAGNOSIS — O10019 Pre-existing essential hypertension complicating pregnancy, unspecified trimester: Secondary | ICD-10-CM | POA: Insufficient documentation

## 2012-06-10 DIAGNOSIS — O99340 Other mental disorders complicating pregnancy, unspecified trimester: Secondary | ICD-10-CM | POA: Insufficient documentation

## 2012-06-10 DIAGNOSIS — I10 Essential (primary) hypertension: Secondary | ICD-10-CM

## 2012-06-10 DIAGNOSIS — F329 Major depressive disorder, single episode, unspecified: Secondary | ICD-10-CM | POA: Insufficient documentation

## 2012-06-10 DIAGNOSIS — O321XX0 Maternal care for breech presentation, not applicable or unspecified: Principal | ICD-10-CM | POA: Insufficient documentation

## 2012-06-10 DIAGNOSIS — E669 Obesity, unspecified: Secondary | ICD-10-CM | POA: Insufficient documentation

## 2012-06-10 LAB — CBC
HCT: 34.7 % — ABNORMAL LOW (ref 36.0–46.0)
Hemoglobin: 11.7 g/dL — ABNORMAL LOW (ref 12.0–15.0)
MCHC: 33.7 g/dL (ref 30.0–36.0)
MCV: 91.8 fL (ref 78.0–100.0)
RDW: 14.4 % (ref 11.5–15.5)

## 2012-06-10 LAB — COMPREHENSIVE METABOLIC PANEL
ALT: 7 U/L (ref 0–35)
Albumin: 2.7 g/dL — ABNORMAL LOW (ref 3.5–5.2)
Alkaline Phosphatase: 93 U/L (ref 39–117)
BUN: 10 mg/dL (ref 6–23)
Chloride: 99 mEq/L (ref 96–112)
Glucose, Bld: 86 mg/dL (ref 70–99)
Potassium: 3.6 mEq/L (ref 3.5–5.1)
Sodium: 132 mEq/L — ABNORMAL LOW (ref 135–145)
Total Bilirubin: 0.3 mg/dL (ref 0.3–1.2)

## 2012-06-10 LAB — LACTATE DEHYDROGENASE: LDH: 169 U/L (ref 94–250)

## 2012-06-10 MED ORDER — OXYTOCIN 40 UNITS IN LACTATED RINGERS INFUSION - SIMPLE MED: 62.5000 mL/h | INTRAVENOUS | Status: DC

## 2012-06-10 MED ORDER — OXYCODONE-ACETAMINOPHEN 5-325 MG PO TABS
1.0000 | ORAL_TABLET | ORAL | Status: DC | PRN
Start: 2012-06-10 — End: 2012-06-10

## 2012-06-10 MED ORDER — IBUPROFEN 600 MG PO TABS: 600.0000 mg | ORAL_TABLET | Freq: Four times a day (QID) | ORAL | Status: DC | PRN

## 2012-06-10 MED ORDER — LIDOCAINE HCL (PF) 1 % IJ SOLN: 30.0000 mL | INTRAMUSCULAR | Status: DC | PRN

## 2012-06-10 MED ORDER — LACTATED RINGERS IV SOLN: 500.0000 mL | INTRAVENOUS | Status: DC | PRN

## 2012-06-10 MED ORDER — OXYTOCIN BOLUS FROM INFUSION
500.0000 mL | INTRAVENOUS | Status: DC
Start: 2012-06-10 — End: 2012-06-10

## 2012-06-10 MED ORDER — LACTATED RINGERS IV SOLN: INTRAVENOUS | Status: DC

## 2012-06-10 MED ORDER — ACETAMINOPHEN 325 MG PO TABS: 650.0000 mg | ORAL_TABLET | ORAL | Status: DC | PRN

## 2012-06-10 MED ORDER — CITRIC ACID-SODIUM CITRATE 334-500 MG/5ML PO SOLN: 30.0000 mL | ORAL | Status: DC | PRN

## 2012-06-10 MED ORDER — MISOPROSTOL 25 MCG QUARTER TABLET
25.0000 ug | ORAL_TABLET | ORAL | Status: DC | PRN
  Filled 2012-06-10: qty 0.25

## 2012-06-10 MED ORDER — ONDANSETRON HCL 4 MG/2ML IJ SOLN: 4.0000 mg | Freq: Four times a day (QID) | INTRAMUSCULAR | Status: DC | PRN

## 2012-06-10 MED ORDER — ZOLPIDEM TARTRATE 5 MG PO TABS: 5.0000 mg | ORAL_TABLET | Freq: Every evening | ORAL | Status: DC | PRN

## 2012-06-10 MED ORDER — SERTRALINE HCL 50 MG PO TABS
50.0000 mg | ORAL_TABLET | Freq: Every day | ORAL | Status: DC
  Filled 2012-06-10: qty 1

## 2012-06-10 NOTE — Progress Notes (Signed)
Pt dresses and exits facility with s/o in stable condition.

## 2012-06-10 NOTE — Progress Notes (Signed)
Provider contacted about increased B/P's, provider stated she is still comfortable sending pt home.  Signs and symptoms of PIH gone over with pt, kick count paper given, as well as signs and symptoms of Labor and information on Version given.  Pt verb. Understanding of all dc instructions and repeats back.

## 2012-06-10 NOTE — Progress Notes (Signed)
Dr. Stefano Gaul in room, talks with pt about going home and scheduling Version in am.  Pt agrees, discharge instructions gone over.  Pt will come back at 7:30 for Version

## 2012-06-10 NOTE — Progress Notes (Signed)
Dr. Stefano Gaul orders to take monitors off while pt decides on POC

## 2012-06-10 NOTE — Progress Notes (Signed)
Dr. Stefano Gaul in room with bedside ultrasound done, baby transverse.  Options discussed with pt.  Pt agrees to have version done per Dr. Stefano Gaul.  Anesthesia notified
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
## 2012-06-11 ENCOUNTER — Inpatient Hospital Stay (HOSPITAL_COMMUNITY)
Admission: AD | Admit: 2012-06-11 | Discharge: 2012-06-13 | DRG: 774 | Disposition: A | Discharge status: Home / Self Care | Payer: Managed Care, Other (non HMO) | Source: Ambulatory Visit | Attending: Obstetrics and Gynecology | Admitting: Obstetrics and Gynecology

## 2012-06-11 ENCOUNTER — Encounter (HOSPITAL_COMMUNITY): Payer: Self-pay | Admitting: *Deleted

## 2012-06-11 DIAGNOSIS — I10 Essential (primary) hypertension: Secondary | ICD-10-CM

## 2012-06-11 DIAGNOSIS — O322XX0 Maternal care for transverse and oblique lie, not applicable or unspecified: Principal | ICD-10-CM | POA: Diagnosis present

## 2012-06-11 DIAGNOSIS — Z8659 Personal history of other mental and behavioral disorders: Secondary | ICD-10-CM

## 2012-06-11 DIAGNOSIS — O09529 Supervision of elderly multigravida, unspecified trimester: Secondary | ICD-10-CM | POA: Diagnosis present

## 2012-06-11 DIAGNOSIS — O2623 Pregnancy care for patient with recurrent pregnancy loss, third trimester: Secondary | ICD-10-CM

## 2012-06-11 DIAGNOSIS — O09523 Supervision of elderly multigravida, third trimester: Secondary | ICD-10-CM

## 2012-06-11 DIAGNOSIS — O1002 Pre-existing essential hypertension complicating childbirth: Secondary | ICD-10-CM | POA: Diagnosis present

## 2012-06-11 DIAGNOSIS — O139 Gestational [pregnancy-induced] hypertension without significant proteinuria, unspecified trimester: Secondary | ICD-10-CM | POA: Diagnosis present

## 2012-06-11 DIAGNOSIS — O99891 Other specified diseases and conditions complicating pregnancy: Secondary | ICD-10-CM

## 2012-06-11 LAB — CBC
HCT: 34.4 % — ABNORMAL LOW (ref 36.0–46.0)
RDW: 14.5 % (ref 11.5–15.5)
WBC: 11 10*3/uL — ABNORMAL HIGH (ref 4.0–10.5)

## 2012-06-11 LAB — TYPE AND SCREEN: Antibody Screen: NEGATIVE

## 2012-06-11 MED ORDER — LIDOCAINE HCL (PF) 1 % IJ SOLN
30.0000 mL | INTRAMUSCULAR | Status: DC | PRN
  Filled 2012-06-11: qty 30

## 2012-06-11 MED ORDER — IBUPROFEN 600 MG PO TABS
600.0000 mg | ORAL_TABLET | Freq: Four times a day (QID) | ORAL | Status: DC
  Administered 2012-06-12 – 2012-06-13 (×6): 600 mg via ORAL
  Filled 2012-06-11 (×6): qty 1

## 2012-06-11 MED ORDER — SENNOSIDES-DOCUSATE SODIUM 8.6-50 MG PO TABS
2.0000 | ORAL_TABLET | Freq: Every day | ORAL | Status: DC
  Administered 2012-06-11 – 2012-06-12 (×2): 2 via ORAL

## 2012-06-11 MED ORDER — SIMETHICONE 80 MG PO CHEW: 80.0000 mg | CHEWABLE_TABLET | ORAL | Status: DC | PRN

## 2012-06-11 MED ORDER — TETANUS-DIPHTH-ACELL PERTUSSIS 5-2.5-18.5 LF-MCG/0.5 IM SUSP
0.5000 mL | Freq: Once | INTRAMUSCULAR | Status: AC
  Administered 2012-06-12: 0.5 mL via INTRAMUSCULAR

## 2012-06-11 MED ORDER — OXYCODONE-ACETAMINOPHEN 5-325 MG PO TABS: 1.0000 | ORAL_TABLET | ORAL | Status: DC | PRN

## 2012-06-11 MED ORDER — DIBUCAINE 1 % RE OINT: 1.0000 "application " | TOPICAL_OINTMENT | RECTAL | Status: DC | PRN

## 2012-06-11 MED ORDER — PRENATAL MULTIVITAMIN CH
1.0000 | ORAL_TABLET | Freq: Every day | ORAL | Status: DC
  Administered 2012-06-12 – 2012-06-13 (×2): 1 via ORAL
  Filled 2012-06-11 (×2): qty 1

## 2012-06-11 MED ORDER — BENZOCAINE-MENTHOL 20-0.5 % EX AERO: 1.0000 "application " | INHALATION_SPRAY | CUTANEOUS | Status: DC | PRN

## 2012-06-11 MED ORDER — OXYTOCIN 40 UNITS IN LACTATED RINGERS INFUSION - SIMPLE MED
1.0000 m[IU]/min | INTRAVENOUS | Status: DC
  Administered 2012-06-11: 8 m[IU]/min via INTRAVENOUS
  Administered 2012-06-11: 6 m[IU]/min via INTRAVENOUS
  Administered 2012-06-11: 4 m[IU]/min via INTRAVENOUS
  Administered 2012-06-11: 2 m[IU]/min via INTRAVENOUS

## 2012-06-11 MED ORDER — IBUPROFEN 600 MG PO TABS
600.0000 mg | ORAL_TABLET | Freq: Four times a day (QID) | ORAL | Status: DC | PRN
  Administered 2012-06-11: 600 mg via ORAL
  Filled 2012-06-11: qty 1

## 2012-06-11 MED ORDER — OXYCODONE-ACETAMINOPHEN 5-325 MG PO TABS
1.0000 | ORAL_TABLET | ORAL | Status: DC | PRN
  Administered 2012-06-11: 1 via ORAL
  Filled 2012-06-11 (×2): qty 1

## 2012-06-11 MED ORDER — OXYTOCIN BOLUS FROM INFUSION: 500.0000 mL | INTRAVENOUS | Status: DC

## 2012-06-11 MED ORDER — DIPHENHYDRAMINE HCL 25 MG PO CAPS: 25.0000 mg | ORAL_CAPSULE | Freq: Four times a day (QID) | ORAL | Status: DC | PRN

## 2012-06-11 MED ORDER — TERBUTALINE SULFATE 1 MG/ML IJ SOLN: 0.2500 mg | Freq: Once | INTRAMUSCULAR | Status: DC | PRN

## 2012-06-11 MED ORDER — CITRIC ACID-SODIUM CITRATE 334-500 MG/5ML PO SOLN: 30.0000 mL | ORAL | Status: DC | PRN

## 2012-06-11 MED ORDER — OXYTOCIN 40 UNITS IN LACTATED RINGERS INFUSION - SIMPLE MED
62.5000 mL/h | INTRAVENOUS | Status: DC
  Administered 2012-06-11: 999 mL/h via INTRAVENOUS
  Filled 2012-06-11: qty 1000

## 2012-06-11 MED ORDER — ZOLPIDEM TARTRATE 5 MG PO TABS: 5.0000 mg | ORAL_TABLET | Freq: Every evening | ORAL | Status: DC | PRN

## 2012-06-11 MED ORDER — LACTATED RINGERS IV SOLN
INTRAVENOUS | Status: DC
  Administered 2012-06-11 (×2): via INTRAVENOUS

## 2012-06-11 MED ORDER — ACETAMINOPHEN 325 MG PO TABS: 650.0000 mg | ORAL_TABLET | ORAL | Status: DC | PRN

## 2012-06-11 MED ORDER — LANOLIN HYDROUS EX OINT: TOPICAL_OINTMENT | CUTANEOUS | Status: DC | PRN

## 2012-06-11 MED ORDER — ONDANSETRON HCL 4 MG PO TABS: 4.0000 mg | ORAL_TABLET | ORAL | Status: DC | PRN

## 2012-06-11 MED ORDER — LACTATED RINGERS IV SOLN: 500.0000 mL | INTRAVENOUS | Status: DC | PRN

## 2012-06-11 MED ORDER — ONDANSETRON HCL 4 MG/2ML IJ SOLN: 4.0000 mg | INTRAMUSCULAR | Status: DC | PRN

## 2012-06-11 MED ORDER — ONDANSETRON HCL 4 MG/2ML IJ SOLN: 4.0000 mg | Freq: Four times a day (QID) | INTRAMUSCULAR | Status: DC | PRN

## 2012-06-11 MED ORDER — WITCH HAZEL-GLYCERIN EX PADS: 1.0000 "application " | MEDICATED_PAD | CUTANEOUS | Status: DC | PRN

## 2012-06-11 NOTE — Progress Notes (Signed)
Provider notified of b/p's, orders to continue to watch.  Pt showing reports no other signs of PIH

## 2012-06-11 NOTE — Progress Notes (Signed)
Subjective: Patient denies any current complaints.    Objective: BP 146/90  Pulse 79  Resp 18  Ht 5\' 7"  (1.702 m)  Wt 239 lb 0.8 oz (108.432 kg)  BMI 37.43 kg/m2  LMP 09/04/2011  FHT:  FHR: 125 bpm, variability: moderate,  accelerations:  Present,  decelerations:  Absent UC:   rare SVE:   Dilation: 2 Effacement (%): 50 Station: -3 Exam by: n. Latravious Levitt,CNM  Dr. Estanislado Pandy at bedside at [phone].  RBA of version again d/w pt by Dr. Estanislado Pandy and pt desires to proceed.   Monitors off for ECV.  FHR Cat 1 and reactive at time monitors d/ced.  Fetus turned from transverse to vertex with one attempt and presentation confirmed with ultrasound.  Pt to high fowlers position after version completed and monitors reapplied with Cat 1 reactive FHR noted post procedure.    Assessment / Plan: IUP 40w 1d S/P external cephalic version-current presentation vertex  Labor: To begin induction of labor Preeclampsia:  no signs or symptoms of toxicity Fetal Wellbeing:  Category I Pain Control:  Labor support without medications I/D:  n/a Anticipated MOD:  NSVD  RBA pitocin IOL d/w pt again and agrees to proceed.  Will begin Pitocin per protocol.   Continue other current care. Continue to observe BP carefully.  Machai Desmith O. 06/12/2012, 4:21 PM

## 2012-06-11 NOTE — MAU Note (Signed)
Pt here for verson

## 2012-06-11 NOTE — Progress Notes (Signed)
Pt ambulates to br, voids, pericare done, gown changed. Pt ambulates to wc.

## 2012-06-11 NOTE — Progress Notes (Signed)
Provider called to come to room, pt feeling increased pressure and wants to push, still adj. Monitors to trace baby.

## 2012-06-11 NOTE — Op Note (Signed)
Pre-procedure diagnosis: IUP at 40+1 weeks, CHTN, transverse position with head on maternal right  Post-procedure: IUP with vertex presentation  Procedure: external cephalic version  Providers: Dr Dois Davenport Bretton Tandy and Elsie Ra CNM  Procedure:  After being informed of the planned procedure with possible risks including but not limited to failure of procedure, fetal heart rate decelerations and need to proceed emergently with cesarean delivery, informed consent was obtained.  Bedside ultrasound confirmed transverse presentation with anterior spine and head to maternal right, AFI of 8 cm and anterior left sided placenta. NST reviewed and reactive. Patient with saline-locked IV and NPO.  Anterior flip version rapidly achieved with vertex confirmed by ultrasound and vaginal exam with 2 cm, 50 %, -3 station and head palpated.Patient was moved to up straight sitting position and Pitocin perfusion was started after confirming that post-version FHR tracing was reassuring.  The procedure was well tolerated by the patient. Proceed with induction of labor for [facility] and variable lie. Anticipate vaginal delivery.

## 2012-06-11 NOTE — Progress Notes (Signed)
Subjective: Reports contractions have increased in intensity at present.  Pt breathing with UCs and changing positions frequently.  Pt in good control with UCs.    Objective: BP 140/94  Pulse 73  Temp(Src) 97.9 F (36.7 C) (Oral)  Resp 18  Ht 5\' 7"  (1.702 m)  Wt 239 lb 0.8 oz (108.432 kg)  BMI 37.43 kg/m2  LMP 09/04/2011  FHT:  FHR: 120 bpm, variability: moderate,  accelerations:  Present,  decelerations:  Absent UC:   regular, every 7-10 minutes.  Pitocin at 61mu/min SVE:  Deferred at present  Assessment / Plan: IUP at 40w 3d IOL s/p ECV Hx Chronic HTN with slightly elevated BPs  Labor: Early active labor Preeclampsia:  no signs or symptoms of toxicity Fetal Wellbeing:  Category I Pain Control:  Labor support without medications I/D:  n/a Anticipated MOD:  NSVD  Continue current care. Continue to observe BP but suspect current elevations due to pain response. Pt continues to decline any analgesia/anesthesia at present.  Tyliek Timberman O. 06/12/2012, 7:31 PM

## 2012-06-11 NOTE — Progress Notes (Addendum)
Dr. Estanislado Pandy at bedside with N.Smith, CNM Bedside U/s machine. Fetus transverse. verson completed at [phone]. Cephalic postion confirmed with SVE and U/S. Vertex 2/5/-3. Pt  tolerated well.

## 2012-06-11 NOTE — Progress Notes (Signed)
At bedside trying to find fhr, pt moving around continuously hurting.

## 2012-06-11 NOTE — Progress Notes (Addendum)
Subjective: Pt moving about in room.  Reports UCs have become stronger and more regular.  Denies any other c/o at present.  Breathing with UCs.    Objective: BP 152/94  Pulse 76  Temp(Src) 98.2 F (36.5 C) (Oral)  Resp 10  Ht 5\' 7"  (1.702 m)  Wt 250 lb (113.399 kg)  BMI 39.15 kg/m2  LMP 09/04/2011    FHT:  FHR: 130 bpm, variability: moderate,  accelerations:  Present,  decelerations:  Absent  UC:   regular, every 7-10 minutes.  Pitocin at 57mu/min SVE:   Deferred at present  Assessment / Plan: IUP at 40w 1d IOL afer successful ECV  Labor: Early latent labor Preeclampsia:  no signs or symptoms of toxicity Fetal Wellbeing:  Category I Pain Control:  Labor support without medications I/D:  n/a Anticipated MOD:  NSVD  Continue current care. Plan AROM with next SVE.    Jos Cygan O. 06/11/2012, 12:02 PM

## 2012-06-12 DIAGNOSIS — O139 Gestational [pregnancy-induced] hypertension without significant proteinuria, unspecified trimester: Secondary | ICD-10-CM | POA: Diagnosis present

## 2012-06-12 LAB — COMPREHENSIVE METABOLIC PANEL
AST: 18 U/L (ref 0–37)
BUN: 10 mg/dL (ref 6–23)
CO2: 23 mEq/L (ref 19–32)
Chloride: 99 mEq/L (ref 96–112)
Creatinine, Ser: 0.75 mg/dL (ref 0.50–1.10)
GFR calc non Af Amer: >90 mL/min (ref >90)
Total Bilirubin: 0.3 mg/dL (ref 0.3–1.2)

## 2012-06-12 LAB — CBC
HCT: 30.6 % — ABNORMAL LOW (ref 36.0–46.0)
MCV: 91.6 fL (ref 78.0–100.0)
RBC: 3.34 MIL/uL — ABNORMAL LOW (ref 3.87–5.11)
WBC: 13.4 10*3/uL — ABNORMAL HIGH (ref 4.0–10.5)

## 2012-06-12 LAB — LACTATE DEHYDROGENASE: LDH: 186 U/L (ref 94–250)

## 2012-06-12 LAB — URIC ACID: Uric Acid, Serum: 5.5 mg/dL (ref 2.4–7.0)

## 2012-06-12 MED ORDER — SERTRALINE HCL 50 MG PO TABS
50.0000 mg | ORAL_TABLET | Freq: Every day | ORAL | Status: DC
  Administered 2012-06-12 – 2012-06-13 (×2): 50 mg via ORAL
  Filled 2012-06-12 (×3): qty 1

## 2012-06-12 MED ORDER — LABETALOL HCL 200 MG PO TABS
200.0000 mg | ORAL_TABLET | Freq: Two times a day (BID) | ORAL | Status: DC
  Administered 2012-06-12 – 2012-06-13 (×3): 200 mg via ORAL
  Filled 2012-06-12 (×5): qty 1

## 2012-06-12 NOTE — Progress Notes (Signed)
Subjective: Continues to have increased contraction frequency and intensity but doing well with UCs.  Continues to change positions frequently to aid in comfort.    Objective: BP 154/95  Pulse 75  Temp(Src) 98 F (36.7 C) (Oral)  Resp 18  Ht 5\' 7"  (1.702 m)  Wt 239 lb 0.8 oz (108.432 kg)  BMI 37.43 kg/m2  LMP 09/04/2011  FHT:  FHR: 120 bpm, variability: moderate,  accelerations:  Present,  decelerations:  Absent UC:   regular, every 2-3 minutes.  Pitocin at 42mu/min SVE: Deferred  Labs: Lab Results  Component Value Date   WBC 13.4* 06/12/2012   HGB 10.4* 06/12/2012   HCT 30.6* 06/12/2012   MCV 91.6 06/12/2012   PLT 259 06/12/2012    Assessment / Plan: IUP at 40w 1d S/P External cephalic version Chronic HTN with elevated blood pressure  Labor: Progressing normally Preeclampsia:  no signs or symptoms of toxicity Fetal Wellbeing:  Category I Pain Control:  Labor support without medications I/D:  n/a Anticipated MOD:  NSVD  Continue current care.    Oronde Hallenbeck O. 06/12/2012, 11:48 PM

## 2012-06-12 NOTE — Progress Notes (Addendum)
Patient ID: Amber Carney, female   DOB: 04/09/1975, 37 y.o.   MRN: [id number] Post Partum Day 1  Subjective: no complaints, up ad lib without syncope, voiding, tolerating PO,  Pain well controlled with po meds,  BF started  Mood stable, bonding well Denies HA/N/V/RUQ pain    Objective: Blood pressure 157/97, pulse 69, temperature 97.6 F (36.4 C), temperature source Oral, resp. rate 18, height 5\' 7"  (1.702 m), weight 250 lb (113.399 kg), last menstrual period 09/04/2011, unknown if currently breastfeeding.  Filed Vitals:   06/11/12 2200 06/11/12 2305 06/12/12 0230 06/12/12 0605  BP: 147/90 150/86 141/84 157/97  Pulse: 86 78 84 69  Temp: 98.2 F (36.8 C) 97.5 F (36.4 C) 97.8 F (36.6 C) 97.6 F (36.4 C)  TempSrc: Oral Oral Oral Oral  Resp: 18 18 18 18   Height:      Weight:         Physical Exam:  General: NAD  Lochia: appropriate Uterine Fundus: firm Perineum: intact  DVT Evaluation: No evidence of DVT seen on physical exam. Negative Homan's sign. No significant calf/ankle edema.   Recent Labs  06/11/12 0830 06/12/12 0610  HGB 11.7* 10.4*  HCT 34.4* 30.6*    Results for orders placed during the hospital encounter of 06/11/12 (from the past 24 hour(s))  CBC     Status: Abnormal   Collection Time    06/12/12  6:10 AM      Result Value Range   WBC 13.4 (*) 4.0 - 10.5 K/uL   RBC 3.34 (*) 3.87 - 5.11 MIL/uL   Hemoglobin 10.4 (*) 12.0 - 15.0 g/dL   HCT 40.9 (*) 81.1 - 91.4 %   MCV 91.6  78.0 - 100.0 fL   MCH 31.1  26.0 - 34.0 pg   MCHC 34.0  30.0 - 36.0 g/dL   RDW 78.2  95.6 - 21.3 %   Platelets 259  150 - 400 K/uL  COMPREHENSIVE METABOLIC PANEL     Status: Abnormal   Collection Time    06/12/12  6:10 AM      Result Value Range   Sodium 131 (*) 135 - 145 mEq/L   Potassium 3.9  3.5 - 5.1 mEq/L   Chloride 99  96 - 112 mEq/L   CO2 23  19 - 32 mEq/L   Glucose, Bld 88  70 - 99 mg/dL   BUN 10  6 - 23 mg/dL   Creatinine, Ser 0.86  0.50 - 1.10 mg/dL   Calcium 8.9  8.4 - 57.8 mg/dL   Total Protein 5.3 (*) 6.0 - 8.3 g/dL   Albumin 2.4 (*) 3.5 - 5.2 g/dL   AST 18  0 - 37 U/L   ALT 7  0 - 35 U/L   Alkaline Phosphatase 80  39 - 117 U/L   Total Bilirubin 0.3  0.3 - 1.2 mg/dL   GFR calc non Af Amer >90  >90 mL/min   GFR calc Af Amer >90  >90 mL/min  LACTATE DEHYDROGENASE     Status: None   Collection Time    06/12/12  6:10 AM      Result Value Range   LDH 186  94 - 250 U/L  URIC ACID     Status: None   Collection Time    06/12/12  6:10 AM      Result Value Range   Uric Acid, Serum 5.5  2.4 - 7.0 mg/dL     Assessment/Plan: Mild anemia Elevated BP's  PIH labs normal  Previous 24 hour urine on 3/28 =138mg  Will place foley and begin 24 hour urine collection, pt agrees, rv'd R/B and possible need for mag sulfate if protein >300mg   CTO BP's closely C/w Dr Estanislado Pandy       LOS: 1 day   LILLARD,SHELLEY M 06/12/2012, 11:33 AM    Seen and agreed. Will start Labetalol 200 mg BID Findings and plan of care reviewed. Questions answered
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
## 2012-06-12 NOTE — Progress Notes (Signed)

## 2012-06-13 LAB — PROTEIN, URINE, 24 HOUR: Protein, 24H Urine: 114 mg/d — ABNORMAL HIGH (ref 50–100)

## 2012-06-13 MED ORDER — IBUPROFEN 600 MG PO TABS: 600.0000 mg | ORAL_TABLET | Freq: Four times a day (QID) | ORAL | Status: AC | PRN

## 2012-06-13 MED ORDER — LABETALOL HCL 200 MG PO TABS: 200.0000 mg | ORAL_TABLET | Freq: Two times a day (BID) | ORAL | Status: AC

## 2012-06-13 NOTE — Progress Notes (Signed)
  Post Partum Day 2: S/P SVB, external version, induction, 1st degree laceration Subjective: Patient up ad lib, denies syncope or dizziness.  Hopes to go home today if 24 hour urine is WNL.  Denies HA, visual sx, or epigastric pain.  Foley cath still in place, will complete 24 hour urine at noon.  Denies pp depression--on Zoloft. Feeding:  Breast Contraceptive plan:   Undecided at present.  Objective: Blood pressure 130/88, pulse 79, temperature 97.6 F (36.4 C), temperature source Oral, resp. rate 18, height 5\' 7"  (1.702 m), weight 238 lb 12.8 oz (108.319 kg), last menstrual period 09/04/2011, unknown if currently breastfeeding.  Filed Vitals:   06/13/12 0526  BP: 130/88  Pulse: 79  Temp: 97.6 F (36.4 C)  Resp: 18   Filed Vitals:   06/12/12 1957 06/12/12 2341 06/13/12 0500 06/13/12 0526  BP: 145/90 125/86  130/88  Pulse: 97 79  79  Temp:    97.6 F (36.4 C)  TempSrc:    Oral  Resp: 18 18  18   Height:      Weight:   238 lb 12.8 oz (108.319 kg)    On Labetalol 200 mg po BID--1st dose yesterday at 3:30p  Physical Exam:  General: alert Lochia: appropriate Uterine Fundus: firm Incision: healing well DVT Evaluation: No evidence of DVT seen on physical exam. Negative Homan's sign. Foley draining clear urine  Recent Labs  06/11/12 0830 06/12/12 0610  HGB 11.7* 10.4*  HCT 34.4* 30.6*    Assessment/Plan: S/P Vaginal delivery day 2 Elevated BP--on Labetalol, 24 hour urine in progress Hx pp depression--on Zoloft, stable.  Continue current care D/C foley with completion of 24 hour urine Await 24 hour urine protein assessment If no pre-eclampsia, anticipate d/c home later this afternoon. If protein = or > 300, anticipate magnesium sulfate for 24 hours. Patient understands plan of care and is agreeable with it. [place] primary MD (who is her best friend) check her BP later this week after d/c as needed.    LOS: 2 days   Nigel Bridgeman 06/13/2012, 8:44 AM

## 2012-06-13 NOTE — Progress Notes (Addendum)
Subjective: Pt reports increased intensity of contractions.  Walking and changing positions frequently.  Good control with contractions.    Objective: BP 145/90  Pulse 97  Temp(Src) 97.6 F (36.4 C) (Oral)  Resp 18  Ht 5\' 7"  (1.702 m)  Wt 238 lb 12.8 oz (108.319 kg)  BMI 37.39 kg/m2  LMP 09/04/2011  FHT:  FHR: 120 bpm, variability: moderate,  accelerations:  Present,  decelerations:  Absent UC:   regular, every 2-4 minutes.  Pitocin at 71mu/min SVE:   Dilation: 5 Effacement (%): 80 Station: -2 Exam by:: n. Tanise Russman,CNM AROM with moderate amount of clear fluid returned.    Assessment / Plan: IUP at 40w 1d S/P ext cephalic version  Labor: Active labor Preeclampsia:  no signs or symptoms of toxicity Fetal Wellbeing:  Category I Pain Control:  Labor support without medications I/D:  n/a Anticipated MOD:  NSVD  Continue pitocin and current care.    Trevelle Mcgurn O. 06/13/2012, 3:40 PM

## 2012-06-13 NOTE — Discharge Summary (Signed)
Vaginal Delivery Discharge Summary  Amber Carney  DOB:    1975/05/20 MRN:    [id number] CSN:    [id number]  Date of admission:                  06/13/12  Date of discharge:                   06/14/12  Procedures this admission: External version Induction of labor due to unstable lie SVB Repair of 1st degree laceration  Newborn Data:  Live born female  Birth Weight: 8 lb 10 oz (3912 g) APGAR: 8, 9  Home with mother.   History of Present Illness:  Ms. Amber Carney is a 37 y.o. female, [phone], who presents at [redacted]w[redacted]d weeks gestation. The patient has been followed at the [facility] and Gynecology division of [facility]. She was admitted external version, due to transverse lie, then induction when version was successful.. Her pregnancy has been complicated by:   Patient Active Problem List  Diagnosis  . AMA (advanced maternal age) multigravida 35+  . Two previous spontaneous abortions (SAB) affecting care of mother, antepartum  . Hx of postpartum depression, currently pregnant  . Hypertension  . Gestational hypertension  . NSVD (normal spontaneous vaginal delivery)     Hospital course:  The patient was admitted for external version--she had been scheduled for induction on the evening of 06/10/12, but was found to be transverse.  Was d/c'd home to return the following morning for external version per patient request.  Version was successful, and induction was begun with pitocin to assist in the baby remaining in the vtx position.   She did have elevated BPs during her labor, so a 24 hour urine was begun at 11am on 06/12/12.  Her labor was not complicated. She proceeded to have a vaginal delivery of a healthy infant, attended by Elsie Ra, CNM. Her delivery was not complicated. Her postpartum course was not complicated.  24 hour protein result was 114 on 06/14/12.    She desired early d/c, and was d/c'd home on day 1.  She remained on Zololft, but  denied any issues with pp depression.  She was to remain on Labetalol 200 mg po BID at present--she will have a BP check by her primary MD, who is personal friend, on Wednesday, and patient will call with those results.  Feeding:  breast  Contraception:  Declines contraception at present.  Discharge hemoglobin:  Hemoglobin  Date Value Range Status  06/12/2012 10.4* 12.0 - 15.0 g/dL Final     HCT  Date Value Range Status  06/12/2012 30.6* 36.0 - 46.0 % Final    Discharge Physical Exam:   General: alert Lochia: appropriate Uterine Fundus: firm Incision: healing well DVT Evaluation: No evidence of DVT seen on physical exam. Negative Homan's sign.  Intrapartum Procedures: spontaneous vaginal delivery Postpartum Procedures: none Complications-Operative and Postpartum: 1st degree perineal laceration  Discharge Diagnoses: Term Pregnancy-delivered  Discharge Information:  Activity:           Per [facility] handout Diet:                routine Medications: Ibuprofen, Labetalol 200 mg po BID Condition:      stable Instructions:  refer to practice specific booklet Discharge to: home  Follow-up Information   Follow up with [facility]. Call in 2 days. (Call with update on blood pressure in Wednsday, or as needed.  Schedule postpartum visit for 6 weeks.)    Contact information:   [address] [place] [address] [phone]       Nigel Bridgeman 06/13/2012

## 2012-06-15 NOTE — Progress Notes (Signed)
Ur chart review completed.  
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
## 2012-07-29 ENCOUNTER — Other Ambulatory Visit (HOSPITAL_COMMUNITY): Payer: Self-pay | Admitting: Internal Medicine

## 2012-07-29 ENCOUNTER — Ambulatory Visit (HOSPITAL_COMMUNITY)
Admission: RE | Admit: 2012-07-29 | Discharge: 2012-07-29 | Disposition: A | Discharge status: Home / Self Care | Payer: Managed Care, Other (non HMO) | Source: Ambulatory Visit | Attending: Internal Medicine | Admitting: Internal Medicine

## 2012-07-29 DIAGNOSIS — K7689 Other specified diseases of liver: Secondary | ICD-10-CM | POA: Insufficient documentation

## 2012-07-29 DIAGNOSIS — R109 Unspecified abdominal pain: Secondary | ICD-10-CM

## 2012-07-29 DIAGNOSIS — R110 Nausea: Secondary | ICD-10-CM

## 2012-08-10 ENCOUNTER — Ambulatory Visit (INDEPENDENT_AMBULATORY_CARE_PROVIDER_SITE_OTHER): Payer: Managed Care, Other (non HMO) | Admitting: Gastroenterology

## 2012-08-10 ENCOUNTER — Encounter: Payer: Self-pay | Admitting: Gastroenterology

## 2012-08-10 VITALS — BP 143/93 | HR 80 | Temp 98.4°F | Ht 67.0 in | Wt 241.0 lb

## 2012-08-10 DIAGNOSIS — R109 Unspecified abdominal pain: Secondary | ICD-10-CM

## 2012-08-10 MED ORDER — OMEPRAZOLE 20 MG PO CPDR: 20.0000 mg | DELAYED_RELEASE_CAPSULE | Freq: Every day | ORAL | Status: AC

## 2012-08-10 NOTE — Patient Instructions (Addendum)
Start taking Prilosec each morning, 30 minutes before breakfast. This helps protect your stomach.  I have requested the most recent blood work from Dr. Margo Aye.  We have set you up for a scan to further evaluate your gallbladder. You may need an upper endoscopy if this is inconclusive. We will let you know about breastfeeding instructions following this scan.  Further recommendations to follow shortly. Follow a low fat diet as you are doing in the meantime.
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
## 2012-08-11 ENCOUNTER — Encounter (HOSPITAL_COMMUNITY)
Admission: RE | Admit: 2012-08-11 | Discharge: 2012-08-11 | Disposition: A | Discharge status: Home / Self Care | Payer: Managed Care, Other (non HMO) | Source: Ambulatory Visit | Attending: Gastroenterology | Admitting: Gastroenterology

## 2012-08-11 DIAGNOSIS — R109 Unspecified abdominal pain: Secondary | ICD-10-CM

## 2012-08-11 MED ORDER — SINCALIDE 5 MCG IJ SOLR
INTRAMUSCULAR | Status: AC
  Filled 2012-08-11: qty 5

## 2012-08-11 MED ORDER — TECHNETIUM TC 99M MEBROFENIN IV KIT
5.0000 | PACK | Freq: Once | INTRAVENOUS | Status: AC | PRN
  Administered 2012-08-11: 5 via INTRAVENOUS

## 2012-08-11 NOTE — Progress Notes (Signed)
Quick Note:  HIDA scan reviewed, with an abnormally low EF of 16.1%.  I question biliary dyskinesia as the culprit of her symptoms. I discussed findings with patient. As her time here is limited until she moves to [place], I would like her to see Drs. Lovell Sheehan or Leticia Penna to discuss possible elective cholecystectomy. I also discussed with her that General Surgery may still want her to pursue and EGD; I would like to get their input first.   Please schedule with Gen Surg as soon as possible. Patient leaving for [place] end of June, but she states she is willing to be as flexible as possible to find the etiology of her symptoms. ______

## 2012-08-11 NOTE — Assessment & Plan Note (Signed)
37 year old very pleasant female who presents with episodic abdominal pain, worsened with fatty foods and associated with large amounts of gas. Loose stools noted at times of pain but resolved spontaneously; otherwise, no constipation or diarrhea. No melena or use of NSAIDs. She recently gave birth to a baby boy approximately 8 weeks ago. Her labs include normal LFTs in April, normocytic anemia with Hgb 10.4. I have requested most recent labs from Dr. Margo Aye. [place] of abdomen with fatty liver. She has no GERD symptoms. Her situation is somewhat complicated, as she will be moving to [place] by the end of June. Her symptoms are quite concerning for biliary etiology, gastritis, less likely PUD. She notes only 3 episodes, with improvement of symptoms occuring with avoidance of high fat foods. I discussed starting a PPI for GI prophylaxis and proceeding with a HIDA scan. I discussed the possibility of needing an EGD if the HIDA is inconclusive. We discussed being unable to completely rule out gastritis, and she understands that an EGD may ultimately be required. With such limited time, we will proceed with Prilosec daily, HIDA asap, and further recommendations after review of HIDA scan.

## 2012-08-12 ENCOUNTER — Other Ambulatory Visit: Payer: Self-pay | Admitting: Gastroenterology

## 2012-08-12 DIAGNOSIS — K828 Other specified diseases of gallbladder: Secondary | ICD-10-CM

## 2012-08-12 NOTE — Progress Notes (Signed)
Referral has been sent to Dr Jenkins/[place] office

## 2012-08-12 NOTE — Progress Notes (Signed)
Cc PCP 

## 2012-08-15 ENCOUNTER — Other Ambulatory Visit (HOSPITAL_COMMUNITY): Payer: Managed Care, Other (non HMO)

## 2012-08-16 ENCOUNTER — Other Ambulatory Visit: Payer: Self-pay

## 2012-08-16 ENCOUNTER — Encounter (HOSPITAL_COMMUNITY): Payer: Self-pay

## 2012-08-16 ENCOUNTER — Encounter (HOSPITAL_COMMUNITY)
Admission: RE | Admit: 2012-08-16 | Discharge: 2012-08-16 | Disposition: A | Discharge status: Home / Self Care | Payer: Managed Care, Other (non HMO) | Source: Ambulatory Visit | Attending: General Surgery | Admitting: General Surgery

## 2012-08-16 HISTORY — DX: Gastro-esophageal reflux disease without esophagitis: K21.9

## 2012-08-16 LAB — CBC WITH DIFFERENTIAL/PLATELET
Basophils Absolute: 0.1 10*3/uL (ref 0.0–0.1)
Eosinophils Absolute: 0.8 10*3/uL — ABNORMAL HIGH (ref 0.0–0.7)
Lymphs Abs: 1.8 10*3/uL (ref 0.7–4.0)
MCH: 30.3 pg (ref 26.0–34.0)
Neutrophils Relative %: 64 % (ref 43–77)
Platelets: 295 10*3/uL (ref 150–400)
RBC: 4.33 MIL/uL (ref 3.87–5.11)
RDW: 13.8 % (ref 11.5–15.5)
WBC: 8.5 10*3/uL (ref 4.0–10.5)

## 2012-08-16 LAB — BASIC METABOLIC PANEL
Calcium: 10.3 mg/dL (ref 8.4–10.5)
Creatinine, Ser: 1 mg/dL (ref 0.50–1.10)
GFR calc non Af Amer: 71 mL/min — ABNORMAL LOW (ref >90)
Glucose, Bld: 91 mg/dL (ref 70–99)
Sodium: 140 mEq/L (ref 135–145)

## 2012-08-16 LAB — HEPATIC FUNCTION PANEL
Alkaline Phosphatase: 89 U/L (ref 39–117)
Total Bilirubin: 0.2 mg/dL — ABNORMAL LOW (ref 0.3–1.2)

## 2012-08-16 LAB — SURGICAL PCR SCREEN: MRSA, PCR: NEGATIVE

## 2012-08-16 NOTE — Patient Instructions (Addendum)
Amber Carney  08/16/2012   Your procedure is scheduled on:   08/17/2012  Report to [facility] at  950  AM.  Call this number if you have problems the morning of surgery: [phone]   Remember:   Do not eat food or drink liquids after midnight.   Take these medicines the morning of surgery with A SIP OF WATER: labetolol,prilosec, zoloft  Do not wear jewelry, make-up or nail polish.  Do not wear lotions, powders, or perfumes.   Do not shave 48 hours prior to surgery. Men may shave face and neck.  Do not bring valuables to the hospital.  [facility] is not responsible  for any belongings or valuables.  Contacts, dentures or bridgework may not be worn into surgery.  Leave suitcase in the car. After surgery it may be brought to your room.  For patients admitted to the hospital, checkout time is 11:00 AM the day of discharge.   Patients discharged the day of surgery will not be allowed to drive  home.  Name and phone number of your driver: family  Special Instructions: Shower using CHG 2 nights before surgery and the night before surgery.  If you shower the day of surgery use CHG.  Use special wash - you have one bottle of CHG for all showers.  You should use approximately 1/3 of the bottle for each shower.   Please read over the following fact sheets that you were given: Pain Booklet, Coughing and Deep Breathing, MRSA Information, Surgical Site Infection Prevention, Anesthesia Post-op Instructions and Care and Recovery After Surgery Laparoscopic Cholecystectomy Laparoscopic cholecystectomy is surgery to remove the gallbladder. The gallbladder is located slightly to the right of center in the abdomen, behind the liver. It is a concentrating and storage sac for the bile produced in the liver. Bile aids in the digestion and absorption of fats. Gallbladder disease (cholecystitis) is an inflammation of your gallbladder. This condition is usually caused by a buildup of gallstones  (cholelithiasis) in your gallbladder. Gallstones can block the flow of bile, resulting in inflammation and pain. In severe cases, emergency surgery may be required. When emergency surgery is not required, you will have time to prepare for the procedure. Laparoscopic surgery is an alternative to open surgery. Laparoscopic surgery usually has a shorter recovery time. Your common bile duct may also need to be examined and explored. Your caregiver will discuss this with you if he or she feels this should be done. If stones are found in the common bile duct, they may be removed. LET YOUR CAREGIVER KNOW ABOUT:  Allergies to food or medicine.  Medicines taken, including vitamins, herbs, eyedrops, over-the-counter medicines, and creams.  Use of steroids (by mouth or creams).  Previous problems with anesthetics or numbing medicines.  History of bleeding problems or blood clots.  Previous surgery.  Other health problems, including diabetes and kidney problems.  Possibility of pregnancy, if this applies. RISKS AND COMPLICATIONS All surgery is associated with risks. Some problems that may occur following this procedure include:  Infection.  Damage to the common bile duct, nerves, arteries, veins, or other internal organs such as the stomach or intestines.  Bleeding.  A stone may remain in the common bile duct. BEFORE THE PROCEDURE  Do not take aspirin for 3 days prior to surgery or blood thinners for 1 week prior to surgery.  Do not eat or drink anything after midnight the night before surgery.  Let your caregiver know  if you develop a cold or other infectious problem prior to surgery.  You should be present 60 minutes before the procedure or as directed. PROCEDURE  You will be given medicine that makes you sleep (general anesthetic). When you are asleep, your surgeon will make several small cuts (incisions) in your abdomen. One of these incisions is used to insert a small, lighted scope  (laparoscope) into the abdomen. The laparoscope helps the surgeon see into your abdomen. Carbon dioxide gas will be pumped into your abdomen. The gas allows more room for the surgeon to perform your surgery. Other operating instruments are inserted through the other incisions. Laparoscopic procedures may not be appropriate when:  There is major scarring from previous surgery.  The gallbladder is extremely inflamed.  There are bleeding disorders or unexpected cirrhosis of the liver.  A pregnancy is near term.  Other conditions make the laparoscopic procedure impossible. If your surgeon feels it is not safe to continue with a laparoscopic procedure, he or she will perform an open abdominal procedure. In this case, the surgeon will make an incision to open the abdomen. This gives the surgeon a larger view and field to work within. This may allow the surgeon to perform procedures that sometimes cannot be performed with a laparoscope alone. Open surgery has a longer recovery time. AFTER THE PROCEDURE  You will be taken to the recovery area where a nurse will watch and check your progress.  You may be allowed to go home the same day.  Do not resume physical activities until directed by your caregiver.  You may resume a normal diet and activities as directed. Document Released: 02/23/2005 Document Revised: 05/18/2011 Document Reviewed: 08/08/2010 [facility] Patient Information 2014 [place], [place]. PATIENT INSTRUCTIONS POST-ANESTHESIA  IMMEDIATELY FOLLOWING SURGERY:  Do not drive or operate machinery for the first twenty four hours after surgery.  Do not make any important decisions for twenty four hours after surgery or while taking narcotic pain medications or sedatives.  If you develop intractable nausea and vomiting or a severe headache please notify your doctor immediately.  FOLLOW-UP:  Please make an appointment with your surgeon as instructed. You do not need to follow up with anesthesia  unless specifically instructed to do so.  WOUND CARE INSTRUCTIONS (if applicable):  Keep a dry clean dressing on the anesthesia/puncture wound site if there is drainage.  Once the wound has quit draining you may leave it open to air.  Generally you should leave the bandage intact for twenty four hours unless there is drainage.  If the epidural site drains for more than 36-48 hours please call the anesthesia department.  QUESTIONS?:  Please feel free to call your physician or the hospital operator if you have any questions, and they will be happy to assist you.

## 2012-08-16 NOTE — H&P (Signed)
NTS SOAP Note  Vital Signs:  Vitals as of: 08/16/2012: Systolic 146: Diastolic 89: Heart Rate 89: Temp 96.49F: Height 53ft 7in: Weight 239Lbs 5 Ounces: Pain Level 9: BMI 37.48  BMI : 37.48 kg/m2  Subjective: This 37 Years 96 Months old Female presents for of    ABDOMINAL PAIN : ,Has had intermittent right upper quadrant abdominal pain radiating to the right flank, nausea, and fatty food intolerance for some time now.  HIDA scan shows low gallbladder ejection fraction with reproducible symptoms with CCK injection.  No fever, chills, jaundice.  Review of Symptoms:  Constitutional:unremarkable   Head:unremarkable    Eyes:unremarkable   Nose/Mouth/Throat:unremarkable Cardiovascular:  unremarkable   Respiratory:unremarkable   Gastrointestin    abdominal pain,nausea Genitourinary:unremarkable     Musculoskeletal:unremarkable   Skin:unremarkable Hematolgic/Lymphatic:unremarkable     Allergic/Immunologic:unremarkable     Past Medical History:    Reviewed   Past Medical History  Surgical History: reconstuctive mouth surgery Medical Problems: HTN Psychiatric History:  Depression Allergies: nkda Medications: labetolol, zoloft   Social History:Reviewed  Social History  Preferred Language: English Race:  White Ethnicity: Not Hispanic / Latino Age: 37 Years 4 Months Marital Status:  M Alcohol: socially Recreational drug(s):  No   Smoking Status: Never smoker reviewed on 08/16/2012 Functional Status reviewed on mm/dd/yyyy ------------------------------------------------ Bathing: Normal Cooking: Normal Dressing: Normal Driving: Normal Eating: Normal Managing Meds: Normal Oral Care: Normal Shopping: Normal Toileting: Normal Transferring: Normal Walking: Normal Cognitive Status reviewed on mm/dd/yyyy ------------------------------------------------ Attention: Normal Decision Making: Normal Language: Normal Memory:  Normal Motor: Normal Perception: Normal Problem Solving: Normal Visual and Spatial: Normal   Family History:  Reviewed  Family Health History Family History is Unknown    Objective Information: General:  Well appearing, well nourished in no distress. Neck:  Supple without lymphadenopathy.  Heart:  RRR, no murmur Lungs:    CTA bilaterally, no wheezes, rhonchi, rales.  Breathing unlabored. Abdomen:Soft, NT/ND, normal bowel sounds, no HSM, no masses.  No peritoneal signs.  Assessment:Chronic cholecystitis  Diagnosis &amp; Procedure Smart Code   Plan:Scheduled for laparoscopic cholecystectomy on 08/17/12.   Patient Education:Alternative treatments to surgery were discussed with patient (and family).  Risks and benefits  of procedure including bleeding, infection, hepatobiliary injury, and the possibility of an open procedure were fully explained to the patient (and family) who gave informed consent. Patient/family questions were addressed.  Follow-up:Pending Surgery

## 2012-08-17 ENCOUNTER — Ambulatory Visit (HOSPITAL_COMMUNITY): Payer: Managed Care, Other (non HMO) | Admitting: Anesthesiology

## 2012-08-17 ENCOUNTER — Encounter (HOSPITAL_COMMUNITY)
Admission: RE | Disposition: A | Discharge status: Home / Self Care | Payer: Self-pay | Source: Ambulatory Visit | Attending: General Surgery

## 2012-08-17 ENCOUNTER — Ambulatory Visit (HOSPITAL_COMMUNITY)
Admission: RE | Admit: 2012-08-17 | Discharge: 2012-08-17 | Disposition: A | Discharge status: Home / Self Care | Payer: Managed Care, Other (non HMO) | Source: Ambulatory Visit | Attending: General Surgery | Admitting: General Surgery

## 2012-08-17 ENCOUNTER — Encounter (HOSPITAL_COMMUNITY): Payer: Self-pay | Admitting: *Deleted

## 2012-08-17 ENCOUNTER — Encounter (HOSPITAL_COMMUNITY): Payer: Self-pay | Admitting: Anesthesiology

## 2012-08-17 DIAGNOSIS — Z79899 Other long term (current) drug therapy: Secondary | ICD-10-CM | POA: Insufficient documentation

## 2012-08-17 DIAGNOSIS — Z01810 Encounter for preprocedural cardiovascular examination: Secondary | ICD-10-CM | POA: Insufficient documentation

## 2012-08-17 DIAGNOSIS — I10 Essential (primary) hypertension: Secondary | ICD-10-CM | POA: Insufficient documentation

## 2012-08-17 DIAGNOSIS — K811 Chronic cholecystitis: Secondary | ICD-10-CM | POA: Insufficient documentation

## 2012-08-17 DIAGNOSIS — Z01812 Encounter for preprocedural laboratory examination: Secondary | ICD-10-CM | POA: Insufficient documentation

## 2012-08-17 HISTORY — PX: CHOLECYSTECTOMY: SHX55

## 2012-08-17 SURGERY — LAPAROSCOPIC CHOLECYSTECTOMY
Anesthesia: General | Site: Abdomen | Wound class: Contaminated

## 2012-08-17 MED ORDER — GLYCOPYRROLATE 0.2 MG/ML IJ SOLN
INTRAMUSCULAR | Status: DC | PRN
  Administered 2012-08-17: 0.6 mg via INTRAVENOUS

## 2012-08-17 MED ORDER — OXYCODONE-ACETAMINOPHEN 7.5-325 MG PO TABS: 1.0000 | ORAL_TABLET | ORAL | Status: AC | PRN

## 2012-08-17 MED ORDER — MIDAZOLAM HCL 2 MG/2ML IJ SOLN
INTRAMUSCULAR | Status: AC
  Filled 2012-08-17: qty 2

## 2012-08-17 MED ORDER — PROPOFOL 10 MG/ML IV BOLUS
INTRAVENOUS | Status: DC | PRN
  Administered 2012-08-17: 150 mg via INTRAVENOUS

## 2012-08-17 MED ORDER — ONDANSETRON HCL 4 MG/2ML IJ SOLN: 4.0000 mg | Freq: Once | INTRAMUSCULAR | Status: DC | PRN

## 2012-08-17 MED ORDER — LIDOCAINE HCL (CARDIAC) 20 MG/ML IV SOLN
INTRAVENOUS | Status: DC | PRN
  Administered 2012-08-17: 50 mg via INTRAVENOUS

## 2012-08-17 MED ORDER — ONDANSETRON HCL 4 MG/2ML IJ SOLN
4.0000 mg | Freq: Once | INTRAMUSCULAR | Status: AC
  Administered 2012-08-17: 4 mg via INTRAVENOUS

## 2012-08-17 MED ORDER — FENTANYL CITRATE 0.05 MG/ML IJ SOLN
25.0000 ug | INTRAMUSCULAR | Status: DC | PRN
  Administered 2012-08-17 (×2): 50 ug via INTRAVENOUS

## 2012-08-17 MED ORDER — NEOSTIGMINE METHYLSULFATE 1 MG/ML IJ SOLN
INTRAMUSCULAR | Status: DC | PRN
  Administered 2012-08-17: 4 mg via INTRAVENOUS

## 2012-08-17 MED ORDER — BUPIVACAINE HCL (PF) 0.5 % IJ SOLN
INTRAMUSCULAR | Status: AC
  Filled 2012-08-17: qty 30

## 2012-08-17 MED ORDER — FENTANYL CITRATE 0.05 MG/ML IJ SOLN
INTRAMUSCULAR | Status: AC
  Filled 2012-08-17: qty 5

## 2012-08-17 MED ORDER — ROCURONIUM BROMIDE 100 MG/10ML IV SOLN
INTRAVENOUS | Status: DC | PRN
  Administered 2012-08-17: 30 mg via INTRAVENOUS

## 2012-08-17 MED ORDER — ARTIFICIAL TEARS OP OINT
TOPICAL_OINTMENT | OPHTHALMIC | Status: AC
  Filled 2012-08-17: qty 3.5

## 2012-08-17 MED ORDER — KETOROLAC TROMETHAMINE 30 MG/ML IJ SOLN
30.0000 mg | Freq: Once | INTRAMUSCULAR | Status: AC
  Administered 2012-08-17: 30 mg via INTRAVENOUS

## 2012-08-17 MED ORDER — SUCCINYLCHOLINE CHLORIDE 20 MG/ML IJ SOLN
INTRAMUSCULAR | Status: DC | PRN
  Administered 2012-08-17: 120 mg via INTRAVENOUS

## 2012-08-17 MED ORDER — DEXAMETHASONE SODIUM PHOSPHATE 4 MG/ML IJ SOLN
4.0000 mg | Freq: Once | INTRAMUSCULAR | Status: AC
  Administered 2012-08-17: 4 mg via INTRAVENOUS

## 2012-08-17 MED ORDER — CEFAZOLIN SODIUM-DEXTROSE 2-3 GM-% IV SOLR
INTRAVENOUS | Status: AC
  Filled 2012-08-17: qty 50

## 2012-08-17 MED ORDER — ROCURONIUM BROMIDE 50 MG/5ML IV SOLN
INTRAVENOUS | Status: AC
  Filled 2012-08-17: qty 1

## 2012-08-17 MED ORDER — PROPOFOL 10 MG/ML IV EMUL
INTRAVENOUS | Status: AC
  Filled 2012-08-17: qty 20

## 2012-08-17 MED ORDER — SODIUM CHLORIDE 0.9 % IR SOLN
Status: DC | PRN
  Administered 2012-08-17: 1000 mL

## 2012-08-17 MED ORDER — LIDOCAINE HCL (PF) 1 % IJ SOLN
INTRAMUSCULAR | Status: AC
  Filled 2012-08-17: qty 5

## 2012-08-17 MED ORDER — FENTANYL CITRATE 0.05 MG/ML IJ SOLN
INTRAMUSCULAR | Status: AC
  Filled 2012-08-17: qty 2

## 2012-08-17 MED ORDER — HEMOSTATIC AGENTS (NO CHARGE) OPTIME
TOPICAL | Status: DC | PRN
  Administered 2012-08-17: 1 via TOPICAL

## 2012-08-17 MED ORDER — ONDANSETRON HCL 4 MG/2ML IJ SOLN
INTRAMUSCULAR | Status: AC
  Filled 2012-08-17: qty 2

## 2012-08-17 MED ORDER — KETOROLAC TROMETHAMINE 30 MG/ML IJ SOLN
INTRAMUSCULAR | Status: AC
  Filled 2012-08-17: qty 1

## 2012-08-17 MED ORDER — DEXAMETHASONE SODIUM PHOSPHATE 4 MG/ML IJ SOLN
INTRAMUSCULAR | Status: AC
  Filled 2012-08-17: qty 1

## 2012-08-17 MED ORDER — SUCCINYLCHOLINE CHLORIDE 20 MG/ML IJ SOLN
INTRAMUSCULAR | Status: AC
  Filled 2012-08-17: qty 1

## 2012-08-17 MED ORDER — FENTANYL CITRATE 0.05 MG/ML IJ SOLN
INTRAMUSCULAR | Status: DC | PRN
  Administered 2012-08-17: 50 ug via INTRAVENOUS
  Administered 2012-08-17: 100 ug via INTRAVENOUS
  Administered 2012-08-17 (×2): 50 ug via INTRAVENOUS

## 2012-08-17 MED ORDER — CHLORHEXIDINE GLUCONATE 4 % EX LIQD: 1.0000 "application " | Freq: Once | CUTANEOUS | Status: DC

## 2012-08-17 MED ORDER — BUPIVACAINE HCL (PF) 0.5 % IJ SOLN
INTRAMUSCULAR | Status: DC | PRN
  Administered 2012-08-17: 10 mL

## 2012-08-17 MED ORDER — NEOSTIGMINE METHYLSULFATE 1 MG/ML IJ SOLN
INTRAMUSCULAR | Status: AC
  Filled 2012-08-17: qty 1

## 2012-08-17 MED ORDER — GLYCOPYRROLATE 0.2 MG/ML IJ SOLN
INTRAMUSCULAR | Status: AC
  Filled 2012-08-17: qty 3

## 2012-08-17 MED ORDER — GLYCOPYRROLATE 0.2 MG/ML IJ SOLN
INTRAMUSCULAR | Status: AC
  Filled 2012-08-17: qty 1

## 2012-08-17 MED ORDER — ENOXAPARIN SODIUM 40 MG/0.4ML ~~LOC~~ SOLN
40.0000 mg | Freq: Once | SUBCUTANEOUS | Status: AC
  Administered 2012-08-17: 40 mg via SUBCUTANEOUS

## 2012-08-17 MED ORDER — CEFAZOLIN SODIUM-DEXTROSE 2-3 GM-% IV SOLR
2.0000 g | INTRAVENOUS | Status: AC
  Administered 2012-08-17: 2 g via INTRAVENOUS

## 2012-08-17 MED ORDER — LACTATED RINGERS IV SOLN: INTRAVENOUS | Status: DC

## 2012-08-17 MED ORDER — ENOXAPARIN SODIUM 40 MG/0.4ML ~~LOC~~ SOLN
SUBCUTANEOUS | Status: AC
  Filled 2012-08-17: qty 0.4

## 2012-08-17 MED ORDER — MIDAZOLAM HCL 2 MG/2ML IJ SOLN
1.0000 mg | INTRAMUSCULAR | Status: DC | PRN
  Administered 2012-08-17: 2 mg via INTRAVENOUS

## 2012-08-17 MED ORDER — GLYCOPYRROLATE 0.2 MG/ML IJ SOLN
0.2000 mg | Freq: Once | INTRAMUSCULAR | Status: AC
  Administered 2012-08-17: 0.2 mg via INTRAVENOUS

## 2012-08-17 MED ORDER — LACTATED RINGERS IV SOLN
INTRAVENOUS | Status: DC | PRN
  Administered 2012-08-17: 11:00:00 via INTRAVENOUS
  Administered 2012-08-17: 1000 mL
  Administered 2012-08-17: 12:00:00 via INTRAVENOUS

## 2012-08-17 SURGICAL SUPPLY — 37 items
APPLIER CLIP LAPSCP 10X32 DD (CLIP) ×2 IMPLANT
BAG HAMPER (MISCELLANEOUS) ×2 IMPLANT
CLOTH BEACON ORANGE TIMEOUT ST (SAFETY) ×2 IMPLANT
COVER LIGHT HANDLE STERIS (MISCELLANEOUS) ×4 IMPLANT
DECANTER SPIKE VIAL GLASS SM (MISCELLANEOUS) ×2 IMPLANT
DURAPREP 26ML APPLICATOR (WOUND CARE) ×2 IMPLANT
ELECT REM PT RETURN 9FT ADLT (ELECTROSURGICAL) ×2
ELECTRODE REM PT RTRN 9FT ADLT (ELECTROSURGICAL) ×1 IMPLANT
FILTER SMOKE EVAC LAPAROSHD (FILTER) ×2 IMPLANT
FORMALIN 10 PREFIL 120ML (MISCELLANEOUS) ×2 IMPLANT
GLOVE BIO SURGEON STRL SZ7.5 (GLOVE) ×2 IMPLANT
GLOVE BIOGEL PI IND STRL 7.0 (GLOVE) ×3 IMPLANT
GLOVE BIOGEL PI INDICATOR 7.0 (GLOVE) ×3
GLOVE ECLIPSE 6.5 STRL STRAW (GLOVE) ×2 IMPLANT
GLOVE ECLIPSE 7.0 STRL STRAW (GLOVE) ×2 IMPLANT
GLOVE EXAM NITRILE MD LF STRL (GLOVE) ×2 IMPLANT
GLOVE SS BIOGEL STRL SZ 6.5 (GLOVE) ×1 IMPLANT
GLOVE SUPERSENSE BIOGEL SZ 6.5 (GLOVE) ×1
GOWN STRL REIN XL XLG (GOWN DISPOSABLE) ×6 IMPLANT
HEMOSTAT SNOW SURGICEL 2X4 (HEMOSTASIS) ×2 IMPLANT
INST SET LAPROSCOPIC AP (KITS) ×2 IMPLANT
IV NS IRRIG 3000ML ARTHROMATIC (IV SOLUTION) IMPLANT
KIT ROOM TURNOVER APOR (KITS) ×2 IMPLANT
KIT TROCAR LAP CHOLE (TROCAR) ×2 IMPLANT
MANIFOLD NEPTUNE II (INSTRUMENTS) ×2 IMPLANT
NS IRRIG 1000ML POUR BTL (IV SOLUTION) ×2 IMPLANT
PACK LAP CHOLE LZT030E (CUSTOM PROCEDURE TRAY) ×2 IMPLANT
PAD ARMBOARD 7.5X6 YLW CONV (MISCELLANEOUS) ×2 IMPLANT
POUCH SPECIMEN RETRIEVAL 10MM (ENDOMECHANICALS) ×2 IMPLANT
SET BASIN LINEN APH (SET/KITS/TRAYS/PACK) ×2 IMPLANT
SET TUBE IRRIG SUCTION NO TIP (IRRIGATION / IRRIGATOR) IMPLANT
SPONGE GAUZE 2X2 8PLY STRL LF (GAUZE/BANDAGES/DRESSINGS) ×2 IMPLANT
STAPLER VISISTAT (STAPLE) ×2 IMPLANT
SUT VICRYL 0 UR6 27IN ABS (SUTURE) ×2 IMPLANT
TAPE CLOTH SURG 4X10 WHT LF (GAUZE/BANDAGES/DRESSINGS) ×2 IMPLANT
WARMER LAPAROSCOPE (MISCELLANEOUS) ×2 IMPLANT
YANKAUER SUCT 12FT TUBE ARGYLE (SUCTIONS) ×2 IMPLANT

## 2012-08-17 NOTE — Brief Op Note (Signed)
When starting iv pt states she feels like she is passing out   Skin dry  Pulse rate in the 40's  States she has done this before when she was going to have an iv or a blood draw

## 2012-08-17 NOTE — Transfer of Care (Signed)
Immediate Anesthesia Transfer of Care Note  Patient: Amber Carney  Procedure(s) Performed: Procedure(s): LAPAROSCOPIC CHOLECYSTECTOMY (N/A)  Patient Location: PACU  Anesthesia Type:General  Level of Consciousness: sedated and patient cooperative  Airway & Oxygen Therapy: Patient Spontanous Breathing and Patient connected to face mask oxygen  Post-op Assessment: Report given to PACU RN and Post -op Vital signs reviewed and stable  Post vital signs: Reviewed and stable  Complications: No apparent anesthesia complications

## 2012-08-17 NOTE — Anesthesia Postprocedure Evaluation (Signed)
  Anesthesia Post-op Note  Patient: Ruchel R Joye  Procedure(s) Performed: Procedure(s): LAPAROSCOPIC CHOLECYSTECTOMY (N/A)  Patient Location: PACU  Anesthesia Type:General  Level of Consciousness: sedated and patient cooperative  Airway and Oxygen Therapy: Patient Spontanous Breathing and Patient connected to face mask oxygen  Post-op Pain: mild  Post-op Assessment: Post-op Vital signs reviewed, Patient's Cardiovascular Status Stable, Respiratory Function Stable, Patent Airway, No signs of Nausea or vomiting and Pain level controlled  Post-op Vital Signs: Reviewed and stable  Complications: No apparent anesthesia complications

## 2012-08-17 NOTE — Interval H&P Note (Signed)
History and Physical Interval Note:  08/17/2012 10:36 AM  Amber Carney  has presented today for surgery, with the diagnosis of chronic cholelithiasis  The various methods of treatment have been discussed with the patient and family. After consideration of risks, benefits and other options for treatment, the patient has consented to  Procedure(s): LAPAROSCOPIC CHOLECYSTECTOMY (N/A) as a surgical intervention .  The patient's history has been reviewed, patient examined, no change in status, stable for surgery.  I have reviewed the patient's chart and labs.  Questions were answered to the patient's satisfaction.     [name]

## 2012-08-17 NOTE — Anesthesia Procedure Notes (Addendum)
Performed by: Corena Pilgrim L   Procedure Name: Intubation Date/Time: 08/17/2012 11:20 AM Performed by: Carolyne Littles, AMY L Pre-anesthesia Checklist: Patient identified, Patient being monitored, Timeout performed, Emergency Drugs available and Suction available Patient Re-evaluated:Patient Re-evaluated prior to inductionOxygen Delivery Method: Circle System Utilized Preoxygenation: Pre-oxygenation with 100% oxygen Intubation Type: IV induction Ventilation: Mask ventilation without difficulty Laryngoscope Size: 3 and Miller Grade View: Grade I Tube type: Oral Tube size: 7.0 mm Number of attempts: 1 Airway Equipment and Method: stylet Placement Confirmation: ETT inserted through vocal cords under direct vision,  positive ETCO2 and breath sounds checked- equal and bilateral Secured at: 21 cm Tube secured with: Tape Dental Injury: Teeth and Oropharynx as per pre-operative assessment

## 2012-08-17 NOTE — Anesthesia Preprocedure Evaluation (Signed)
Anesthesia Evaluation  Patient identified by MRN, date of birth, ID band Patient awake    Reviewed: Allergy & Precautions, H&P , NPO status , Patient's Chart, lab work & pertinent test results  Airway Mallampati: I TM Distance: >3 FB     Dental  (+) Teeth Intact and Implants,    Pulmonary sleep apnea ,  breath sounds clear to auscultation        Cardiovascular METS: PIH. hypertension, Pt. on medications Rhythm:Regular Rate:Normal     Neuro/Psych  Headaches (resolved),    GI/Hepatic   Endo/Other    Renal/GU      Musculoskeletal   Abdominal   Peds  Hematology   Anesthesia Other Findings   Reproductive/Obstetrics                           Anesthesia Physical Anesthesia Plan  ASA: III  Anesthesia Plan: General   Post-op Pain Management:    Induction: Intravenous, Rapid sequence and Cricoid pressure planned  Airway Management Planned: Oral ETT  Additional Equipment:   Intra-op Plan:   Post-operative Plan: Extubation in OR  Informed Consent: I have reviewed the patients History and Physical, chart, labs and discussed the procedure including the risks, benefits and alternatives for the proposed anesthesia with the patient or authorized representative who has indicated his/her understanding and acceptance.     Plan Discussed with:   Anesthesia Plan Comments:         Anesthesia Quick Evaluation

## 2012-08-17 NOTE — Op Note (Signed)
Patient:  Amber Carney  DOB:  11-Dec-1975  MRN:  [id number]   Preop Diagnosis:  Chronic cholecystitis  Postop Diagnosis:  Same  Procedure:  Laparoscopic cholecystectomy  Surgeon:  Franky Macho, M.D.  Anes:  General endotracheal  Indications:  Patient is a 37 year old white female presents with biliary colic secondary to chronic cholecystitis. The risks and benefits of the procedure including bleeding, infection, hepatobiliary injury, and the possibility of an open procedure were fully explained to the patient, who gave informed consent.  Procedure note:  The patient was placed in the supine position. After induction of general endotracheal anesthesia, the abdomen was prepped and draped using usual sterile technique with DuraPrep. Surgical site confirmation was performed.  A supraumbilical incision was made down to the fascia. A Veress needle was introduced into the abdominal cavity and confirmation of placement was done using the saline drop test. The abdomen was then insufflated to 16 mm mercury pressure. An 11 mm trocar was introduced into the abdominal cavity under direct visualization without difficulty. The patient was placed in reverse Trendelenburg position and additional 11 mm trocar was placed the epigastric region and 5 mm trochars were placed the right upper quadrant and right flank regions. Liver was inspected and noted within normal limits. The gallbladder was retracted superiorly and laterally in a dynamic fashion in order to expose the triangle of Calot. The cystic duct was first identified. Its juncture to the infundibulum was fully identified. Endoclips were placed proximally and distally on the cystic duct, and the cystic duct was divided. This is likewise done to the cystic artery. The gallbladder was then freed away from the gallbladder fossa using Bovie electrocautery. The was some bile spillage during the dissection of the gallbladder. The gallbladder was then removed using  an Endo Catch bag and sent to pathology further examination. No abnormal bleeding or bile leakage was noted from the gallbladder bed. Surgicel is placed the gallbladder fossa. All fluid and air were then evacuated from the abdominal cavity prior to removal of the trochars.  All wounds were irrigated normal saline. All wounds were injected with 0.5% Sensorcaine. The supraumbilical fascia was reapproximated using 0 Vicryl interrupted suture. All skin incisions were closed using staples. Betadine ointment and dry sterile dressings were applied.  All tape and needle counts were correct at the end of the procedure. Patient was extubated in the operating room and transferred to PACU in stable condition.  Complications:  None  EBL:  Minimal  Specimen:  Gallbladder

## 2012-08-17 NOTE — Preoperative (Signed)
Beta Blockers   Reason not to administer Beta Blockers:Not Applicable 

## 2012-08-18 ENCOUNTER — Encounter (HOSPITAL_COMMUNITY): Payer: Self-pay | Admitting: General Surgery

## 2013-01-12 ENCOUNTER — Other Ambulatory Visit: Payer: Self-pay

## 2013-03-10 NOTE — Progress Notes (Signed)
JUN 2014 CHOLECYSTECTOMY: CHRONIC CHOLECYSTITIS  REVIEWED.

## 2013-12-28 IMAGING — CR DG CHEST 2V
2 series · 2 of 2 positions shown · non-contrast
Comparison: None

CLINICAL DATA: Right side chest pain, pain with deep inspiration,
history asthma, 9 weeks pregnant

CHEST - 2 VIEW

[view not recorded (1 of 2)]
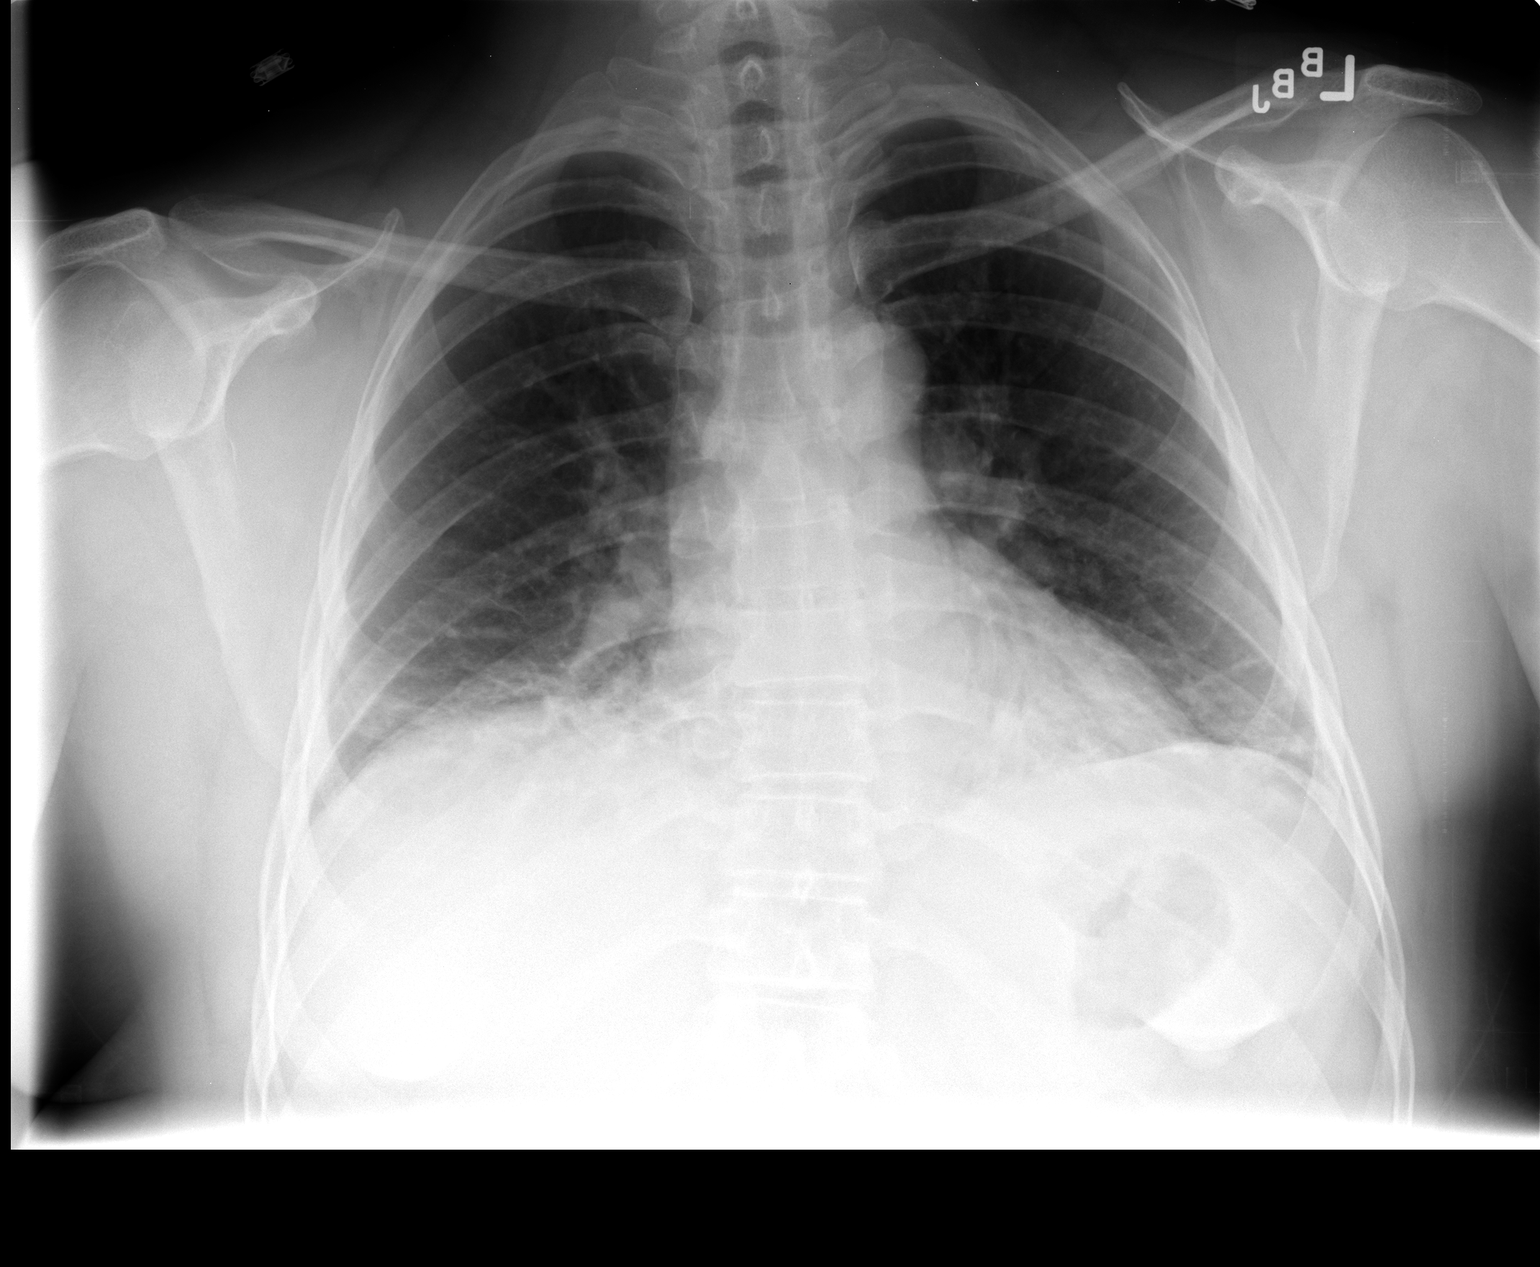

[view not recorded (2 of 2)]
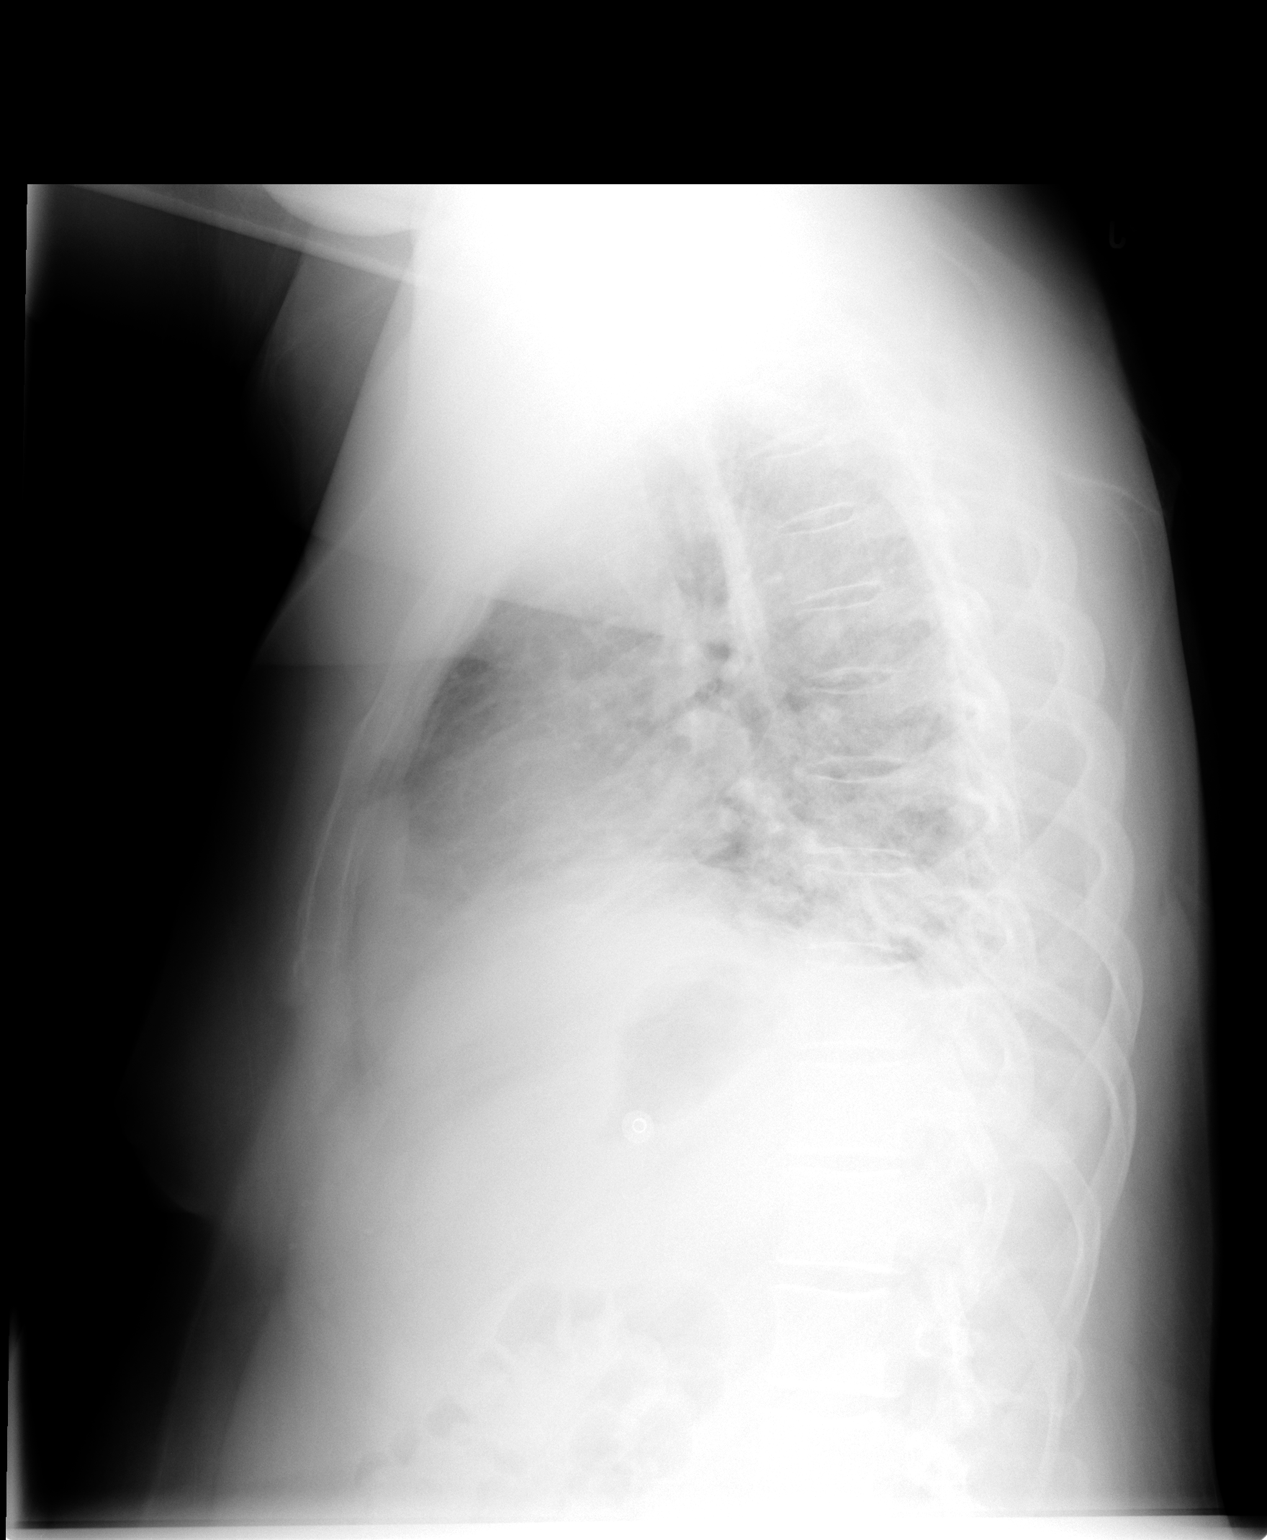

[2 of 2 positions shown; findings below may reference images not displayed]

FINDINGS: Prominence of cardiac silhouette which could be related to
pregnancy.
Mediastinal contours and pulmonary vascularity normal.
Bibasilar atelectasis versus infiltrate.
Upper lungs clear.
No pleural effusion or pneumothorax.
No acute osseous findings.
IMPRESSION: Bibasilar atelectasis versus infiltrate.

## 2013-12-28 IMAGING — US US EXTREM LOW VENOUS BILAT
1 series · 14 of 24 positions shown · non-contrast
Comparison: None.

CLINICAL DATA: Shortness of breath and current pregnancy.

BILATERAL LOWER EXTREMITY VENOUS DUPLEX ULTRASOUND
TECHNIQUE: Gray-scale sonography with graded compression, as well
as color Doppler and duplex ultrasound, were performed to evaluate
the deep venous system of both lower extremities from the level of
the common femoral vein through the popliteal and proximal calf
veins.  Spectral Doppler was utilized to evaluate flow at rest and
with distal augmentation maneuvers.

[Series 1: us extrem low venous bilat · 0.11mm/px · 14 of 82 slices shown]
[im 1/82]
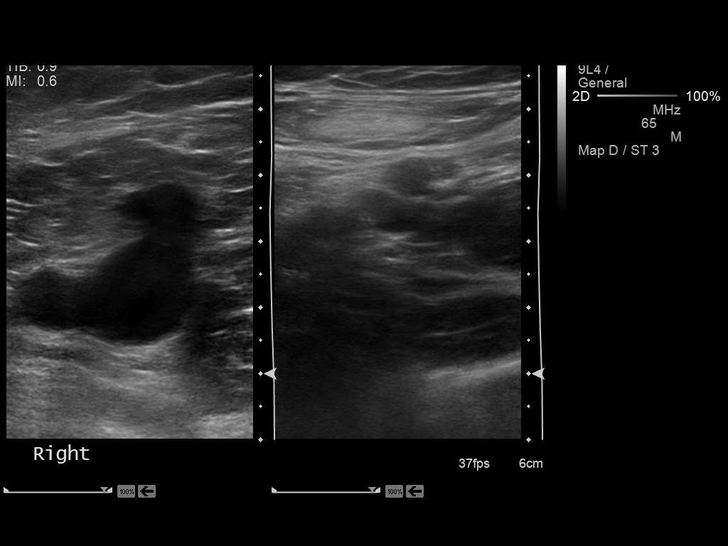
[im 8/82]
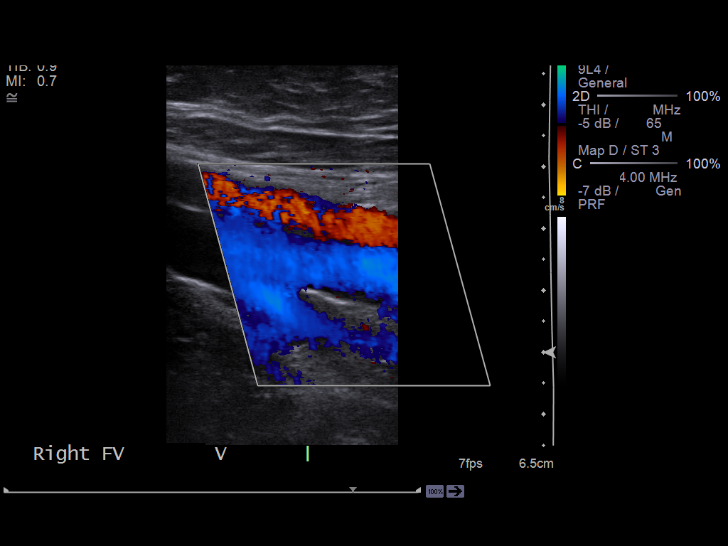
[im 15/82]
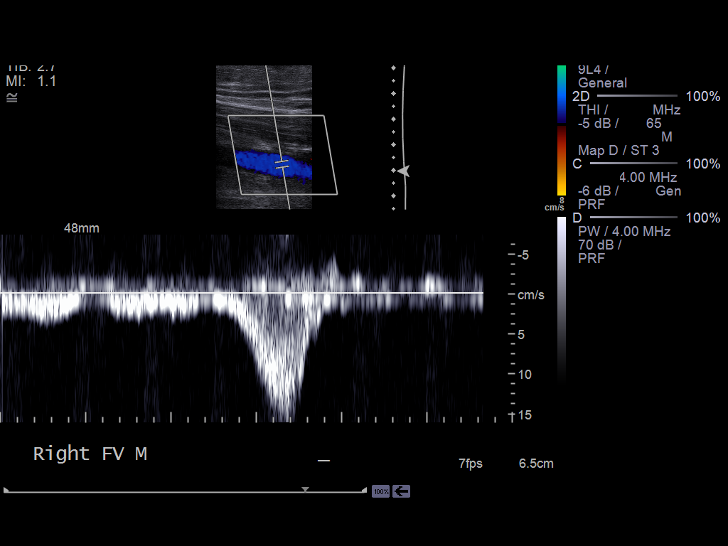
[im 22/82]
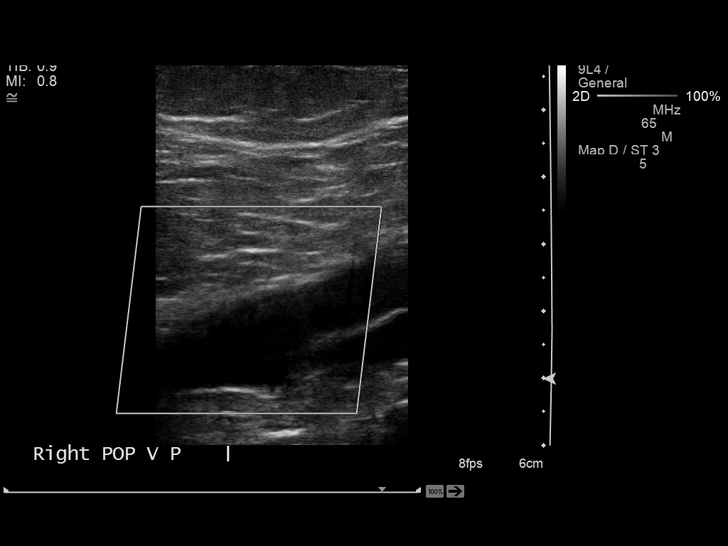
[im 25/82]
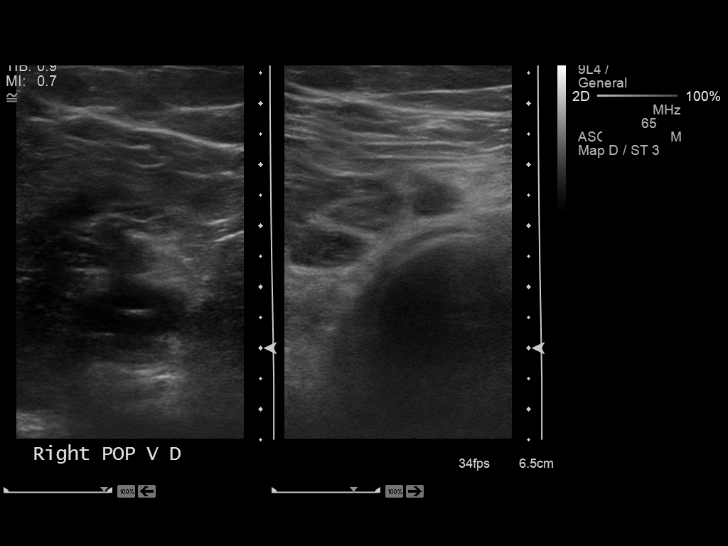
[im 32/82]
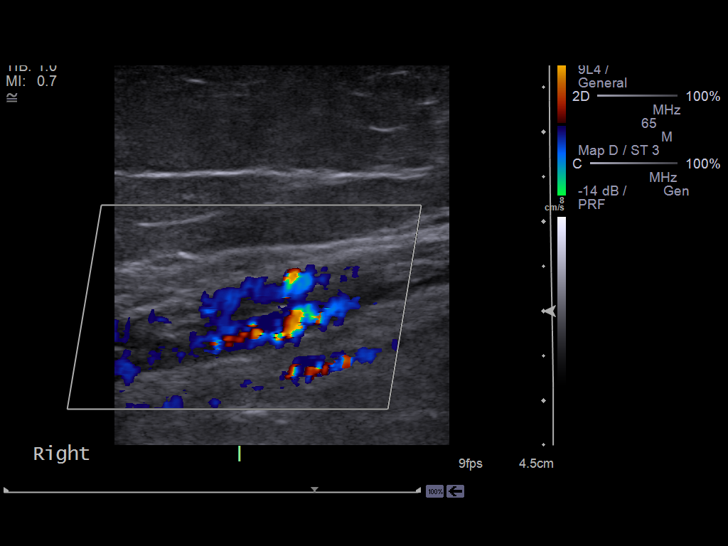
[im 39/82]
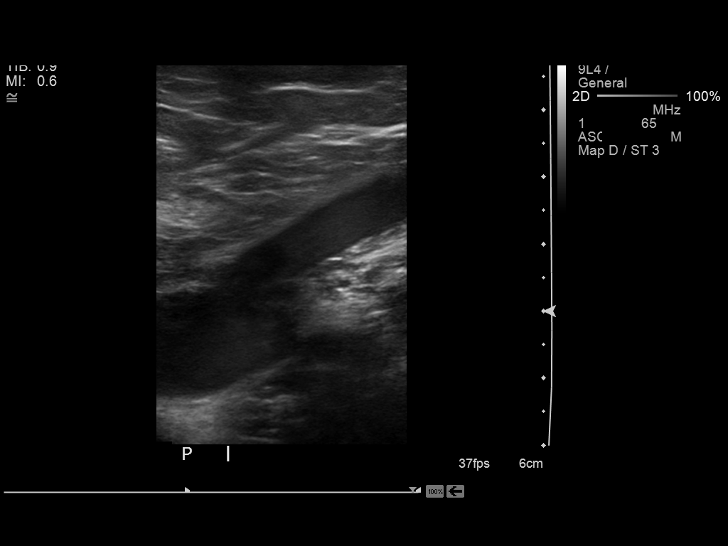
[im 43/82]
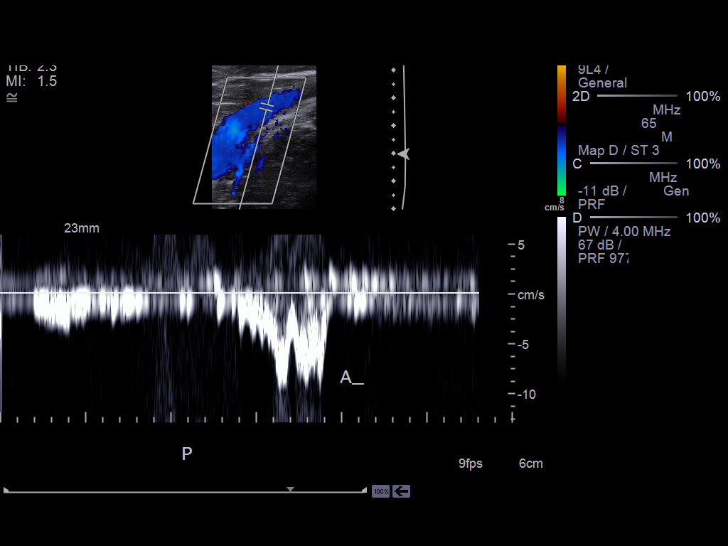
[im 50/82]
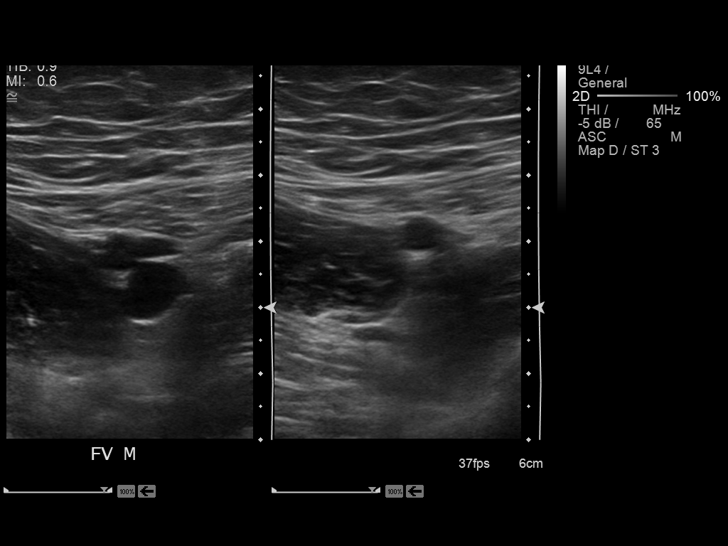
[im 57/82]
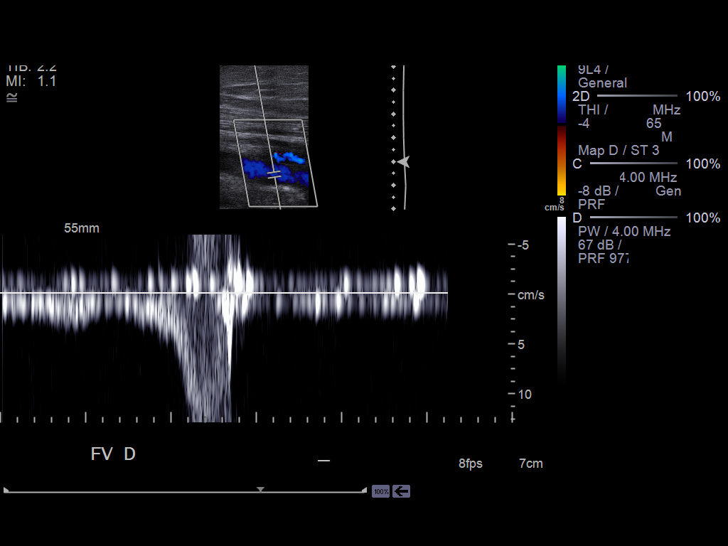
[im 64/82]
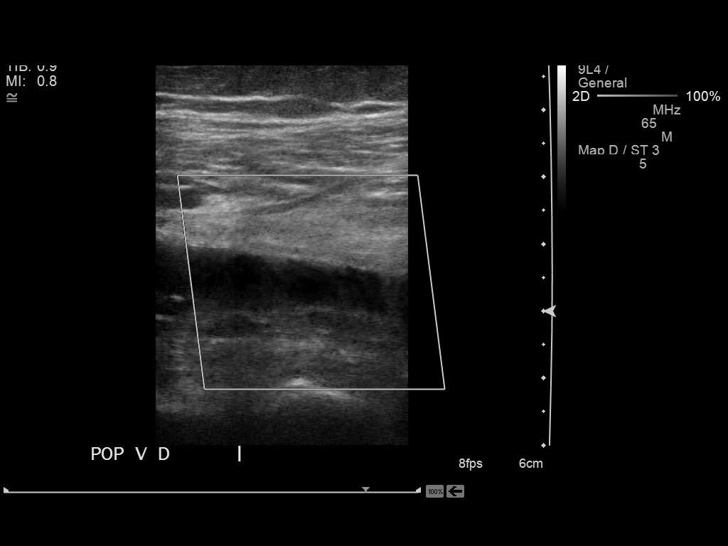
[im 67/82]
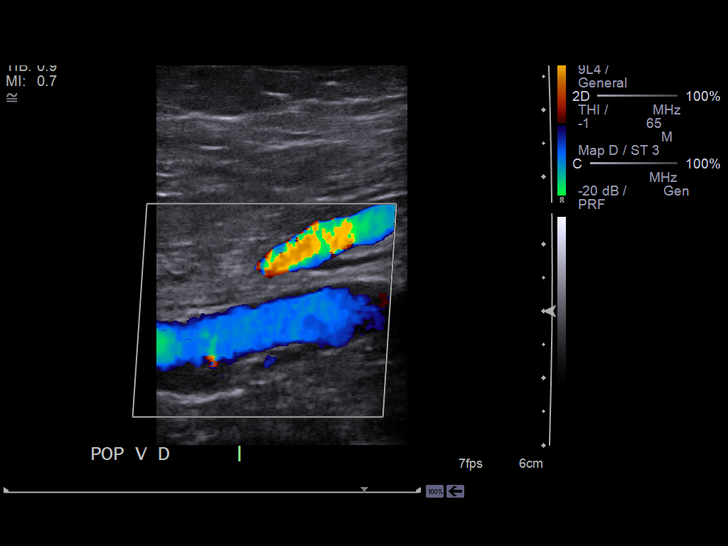
[im 74/82]
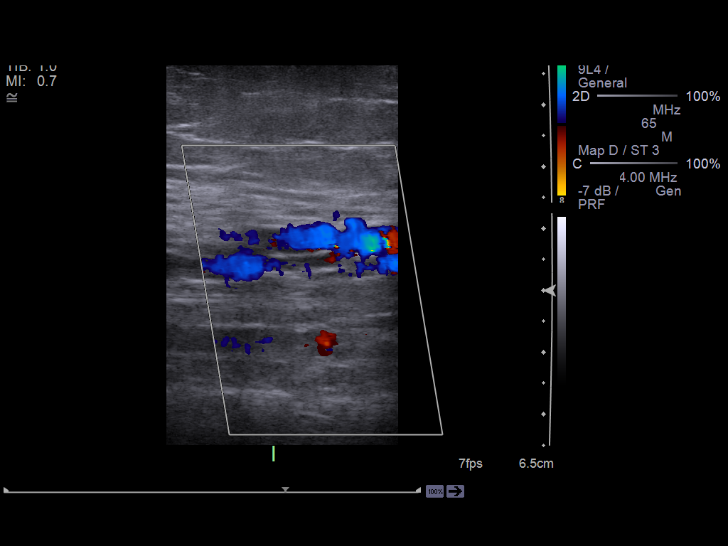
[im 82/82]
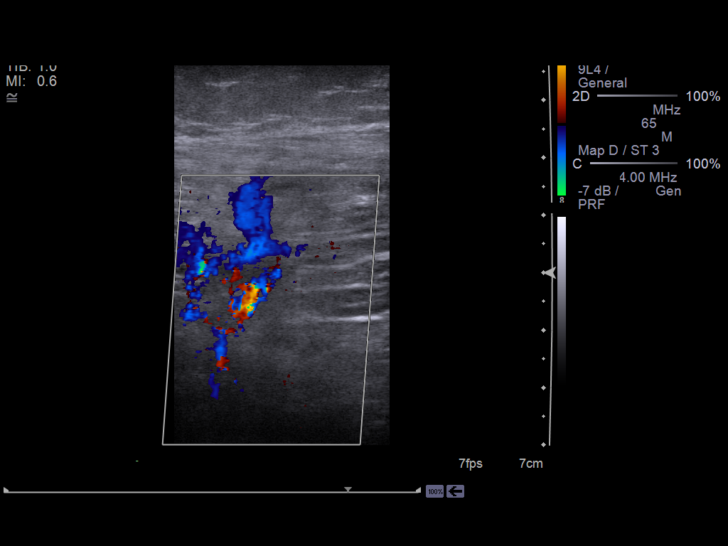

[14 of 24 positions shown; findings below may reference images not displayed]

FINDINGS: Normal compressibility of bilateral common femoral,
superficial femoral, and popliteal veins is demonstrated, as well
as the visualized proximal calf veins.  No filling defects to
suggest DVT on grayscale or color Doppler imaging.  Doppler
waveforms show normal direction of venous flow, normal respiratory
phasicity and response to augmentation.  The great saphenous veins
are normally patent bilaterally.
IMPRESSION: No evidence of deep vein thrombosis in either lower extremity.

## 2013-12-28 IMAGING — CT CT ANGIO CHEST
2 of 6 series · 6 of 36 positions shown · IV contrast (Omnipaque 300)
Comparison: None

CLINICAL DATA: , right side chest pain, elevated D-dimer, pregnant

CT ANGIOGRAPHY CHEST
TECHNIQUE: Multidetector CT imaging of the chest using the
standard protocol during bolus administration of intravenous
contrast. Multiplanar reconstructed images including MIPs were
obtained and reviewed to evaluate the vascular anatomy.
Contrast: 100mL OMNIPAQUE IOHEXOL 350 MG/ML SOLN

[Series 4: pe 3.0 b40f · axial · 0.69mm/px · z∈[+840,+1008]mm · 5 of 84 slices shown]
[im 14/84  lung]
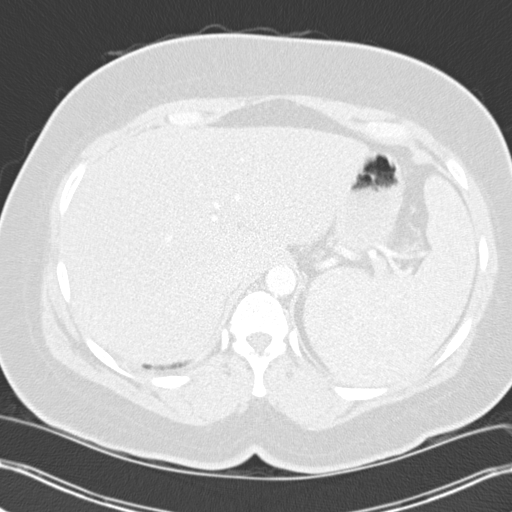
[im 28/84  mediastinal]
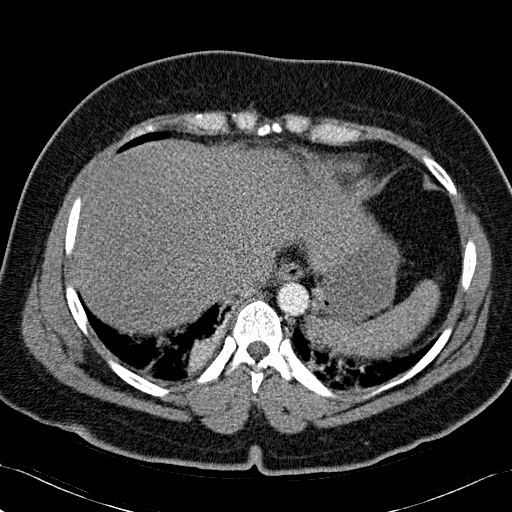
[im 42/84  lung]
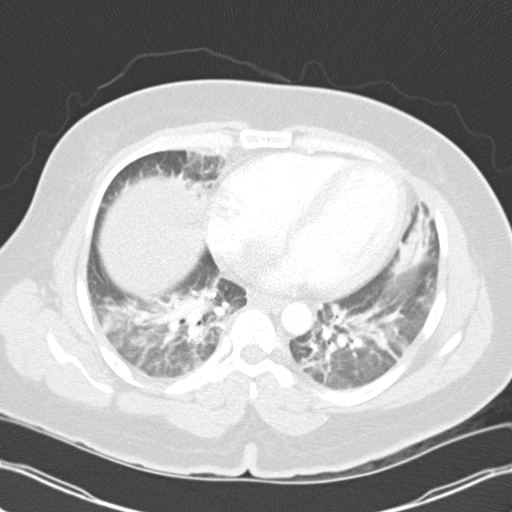
[im 56/84  mediastinal]
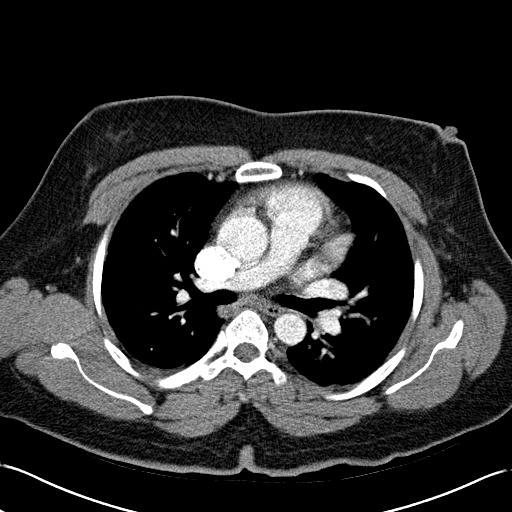
[im 70/84  lung]
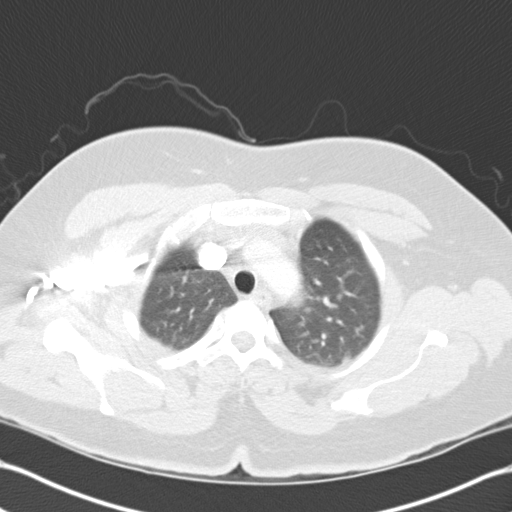

[Series 6: mpr coronal pe 3mm · coronal · 0.50mm/px · 1 of 82 slices shown]
[im 41/82  mediastinal]
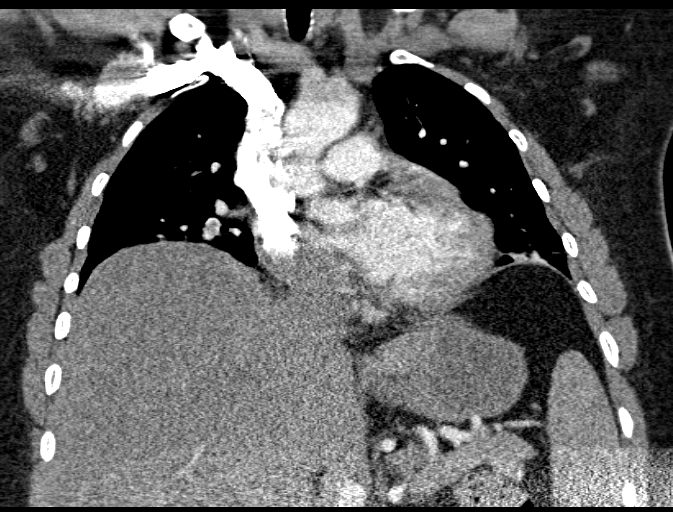

[6 of 36 positions shown; findings below may reference images not displayed]

FINDINGS: Degradation of image quality secondary to body habitus.
Aorta normal caliber without aneurysm or dissection.
Unable to adequately assess pulmonary arterial branches in the
lower lobes due to respiratory motion and beam hardening.
No large or central pulmonary emboli are identified.
No thoracic adenopathy.
Visualized portion of upper abdomen raises question of
splenomegaly, spleen measuring 15.6 x 9.2 cm in axial dimensions
image 73.
Lung windows demonstrate bibasilar infiltrates and volume loss.
Upper lungs clear.
No acute osseous findings.
IMPRESSION: Bilateral lower lobe consolidation and volume loss.
No large or central pulmonary emboli are identified.
Unable to adequately assess bilateral lower lobe and peripheral
pulmonary arterial branches due to combination of respiratory
motion and artifacts from body habitus.
Question splenomegaly.

## 2014-01-08 ENCOUNTER — Encounter (HOSPITAL_COMMUNITY): Payer: Self-pay | Admitting: General Surgery
